# Patient Record
Sex: Female | Born: 1965 | Race: White | Hispanic: No | Marital: Married
Health system: Midwestern US, Academic
[De-identification: ages and names within clinical notes are randomized; demographics above are authoritative.]

## PROBLEM LIST (undated history)

## (undated) DIAGNOSIS — D352 Benign neoplasm of pituitary gland: Principal | ICD-10-CM

---

## 2014-07-01 ENCOUNTER — Ambulatory Visit: Admit: 2014-07-01 | Discharge: 2014-07-01 | Payer: BLUE CROSS/BLUE SHIELD | Attending: "Endocrinology

## 2014-07-01 DIAGNOSIS — E8881 Metabolic syndrome: Secondary | ICD-10-CM

## 2014-07-01 NOTE — Progress Notes (Signed)
[facility]   [facility]  Original Visit Form    07/01/2014    Wagner FBPZW:[phone] (home)   Work Phone: There is no work phone number on file.  49 y.o.  female  Race: Caucasian   Marital Status: married  No family status information on file.       HPI: Previous patient had seen Dr. Freddy Finner 15 years ago for PCOS with intracranial hypertension.  Initially referred due to intracranial hypertension which was suspected to be due to prolactinoma (went under extensive testing) declined shunt and did not follow up with Neurologist.  Due to menstrual irregularities patient was suggested to see Dr. Freddy Finner.  Patient started on metformin at that time which did improve her symptoms.    In 2006 patient started traveling to [place] and saw a different physician who decided to stop metformin therapy.  Patient was unable to get back to [place] regularly with schedule.  Patient back in town now and would like to re-establish care with Dr. Freddy Finner    Female History:  ?? Menarche: 12  ?? Periods: Irregular until 24, light  ?? Menopause: had cessation of periods for 6 years, was restarted on metformin in January and has resumed period  ?? Pregnancy: 0 Pregnancies  ?? BCx: OCPs until the age of 48, Depo Provera at 65 which caused large weight gain, fatigue, headaches    PMHX:    PCOS   Intracranial Hypertension   Suspicion regarding cushing syndrome but no diagnosis   Systemic Hypertension   Prolactinoma   Campylobacter infection history    PSHX:    Tonsillectomy when young   Toe reconstruction decades ago    FAMHX:    Sister with PCOS/Fibromyalgia   Maternal Aunt PCOS/Ovarian Cancer   Lung Cancer in Mother    Maternal side history of heart disease in early age (Uncle 16)   13 (Bladder Cancer)    SOCHX:    Working at [employer]   Married   Tobacco: None   EtOH: Rare   Illicit Drug: None    MEDS:    PCOS: Metformin 500 mg Daily    HTN: HCTZ 25mg  Daily / Toprol XL 50mg     Other: ASA / Nexium /  Flonase     Allergies:    Iodine   Lisinopril (Rash)   Sulfa (Rash)   Norvasc (Felt Loopy)       No results for input(s): CHOL, HDL, TRIG in the last 72 hours.    Invalid input(s): CHOLHDLR, LDLCALCU  CBC: No results found for: WBC, RBC, HGB, HCT, MCV, MCH, MCHC, RDW, PLT, MPV  CMP:  No results found for: NA, K, CL, CO2, BUN, CREATININE, GFRAA, AGRATIO, LABGLOM, GLUCOSE, PROT, LABALBU, CALCIUM, BILITOT, ALKPHOS, AST, ALT  BUN/Creatinine:  No results found for: BUN, CREATININE  HgBA1c:  No components found for: HGBA1C    History reviewed. No pertinent past medical history.  History reviewed. No pertinent past surgical history.  History   Substance Use Topics   ??? Smoking status: Never Smoker    ??? Smokeless tobacco: Never Used   ??? Alcohol Use: 0.6 oz/week     1 Glasses of wine per week     History reviewed. No pertinent family history.    Weight History:  High School: 100 lbs  College/Service: 140 lbs  Most: 240 lbs    Current Diet: Lean Protein, low carb, Low fat dairy, lots of vegetables    Exercise: Yes  Type:  Water Aerobics  Amount: 3x a week for an hour    Current Outpatient Prescriptions   Medication Sig Dispense Refill   ??? aspirin 81 MG chewable tablet Take 81 mg by mouth daily Chew 1 tablet(s) every day by oral route.     ??? cabergoline (DOSTINEX) 0.5 MG tablet Take 0.5 mg by mouth Twice a Week cabergoline 0.5 mg tablet     ??? hydrochlorothiazide (HYDRODIURIL) 25 MG tablet Take 25 mg by mouth daily hydrochlorothiazide 25 mg tablet     ??? ibuprofen (ADVIL;MOTRIN) 200 MG tablet Take 200 mg by mouth every 6 hours as needed one TID as needed     ??? metFORMIN (GLUCOPHAGE) 500 MG tablet Take 500 mg by mouth daily (with breakfast) metformin 500 mg tablet     ??? metoprolol (TOPROL-XL) 50 MG XL tablet Take 50 mg by mouth daily metoprolol succinate ER 50 mg tablet,extended release 24 hr     ??? fluticasone (FLONASE) 50 MCG/ACT nasal spray 2 sprays by Nasal route daily      ??? esomeprazole (NEXIUM) 40 MG capsule Take 40 mg by  mouth every morning (before breakfast)        No current facility-administered medications for this visit.       Review of Systems     Constitutional: Negative.    HENT: Negative.  Negative for hearing loss, ear pain and neck pain.    Eyes: Negative.    Respiratory: Negative.    Cardiovascular: Negative.    Gastrointestinal: Negative.    Genitourinary: Negative.    Musculoskeletal: Negative.    Skin: Negative.    Neurological: Negative.    Hematological: Negative.    Psychiatric/Behavioral: Negative.      Physical Exam   Vitals reviewed.  Constitutional: appears well-nourished.   HENT: atraumatic, normocephalic  Carotid bruits: No  Other Pulses: strong in all four extremities  Lymphadenopathy: Not examined  Thyroid-not examined   Eyes: not examined  Cardiovascular: Normal rate, regular rhythm and normal heart sounds.  Exam reveals no gallop and no friction rub. No murmur heard.  Pulmonary/Chest: not examined   Abdominal: not examined   Musculoskeletal: not examined  Neurological: not examined  Skin: not examined  Psychiatric: not examined    Arthritis: painful in am, hard to move, stiff, get better during the day, fingers and hips.     Examined by: Delorse Limber    Diagnosis:pcos, prolactinoma, obesity    Treatment Plan: increase metformin to equivalent of 2.5 g per day, check for alternative for cabergoline.   CJ Revis Whalin md

## 2014-07-02 NOTE — Telephone Encounter (Signed)
pt calling to have her prescription for Metformin transdermal 25% apply 125mg  twice a day to skin.  called into [facility] [phone] or fax [phone]

## 2014-07-03 NOTE — Telephone Encounter (Signed)
Spoke with the pharmacist at [facility]) gave him her Rx refill they will contact the patient.

## 2014-07-03 NOTE — Telephone Encounter (Signed)
[employer] has a question about the ACTH Stimulation test.   [phone]

## 2014-07-03 NOTE — Telephone Encounter (Signed)
I tried to return this call, but this is not a working number.

## 2014-07-08 LAB — METHYLMALONIC ACID, SERUM: Methylmalonic Acid, Serum: 121 nmol/L (ref 0–378)

## 2014-07-08 LAB — PAI-1+LP(A)+PAI-1 POLYMORPHISM: Lipoprotein (a): <3 mg/dL

## 2014-07-08 LAB — CMP14+LP+CBC/D/PLT+T4+TSH+H...
17-Hydroxyprogesterone: 16 ng/dL
ALT: 19 IU/L (ref 0–32)
AST: 18 IU/L (ref 0–40)
Albumin/Globulin Ratio: 1.5 (ref 1.1–2.5)
Albumin: 4.2 g/dL (ref 3.5–5.5)
Alkaline Phosphatase: 74 IU/L (ref 39–117)
Androstenedione: 82 ng/dL (ref 41–262)
BUN/Creatinine Ratio: 17 (ref 9–23)
BUN: 13 mg/dL (ref 6–24)
Basophils %: 0 %
Basophils Absolute: 0 10*3/uL (ref 0.0–0.2)
C-Peptide: 4.2 ng/mL (ref 1.1–4.4)
CO2: 23 mmol/L (ref 18–29)
Calcium: 9.2 mg/dL (ref 8.7–10.2)
Chloride: 100 mmol/L (ref 97–108)
Cholesterol, Total: 207 mg/dL — ABNORMAL HIGH (ref 100–199)
Cortisol: 10.5 ug/dL
Creatinine: 0.76 mg/dL (ref 0.57–1.00)
DHEAS (DHEA Sulfate): 118.7 ug/dL (ref 41.2–243.7)
Eosinophils %: 2 %
Eosinophils Absolute: 0.1 10*3/uL (ref 0.0–0.4)
Estradiol, Sensitive: <5 pg/mL
FSH: 15.3 m[IU]/mL
Free Testosterone: 2.2 pg/mL (ref 0.0–4.2)
GFR African American: 106 mL/min/{1.73_m2} (ref >59)
GFR Non-African American: 92 mL/min/{1.73_m2} (ref >59)
Globulin: 2.8 g/dL (ref 1.5–4.5)
Glucose: 85 mg/dL (ref 65–99)
Granulocyte Immature Abs: 0 10*3/uL (ref 0.0–0.1)
HDL: 46 mg/dL (ref >39)
Hematocrit: 41.5 % (ref 34.0–46.6)
Hemoglobin A1C: 5.4 % (ref 4.8–5.6)
Hemoglobin: 13.8 g/dL (ref 11.1–15.9)
Homocystine,Blood: 17.2 umol/L — ABNORMAL HIGH (ref 0.0–15.0)
Immature Granulocytes: 0 %
Insulin: 16.2 u[IU]/mL (ref 2.6–24.9)
LDL Calculated: 115 mg/dL — ABNORMAL HIGH (ref 0–99)
LH: 7.7 m[IU]/mL
Lactate Serum/Plasma: 7.1 mg/dL (ref 4.5–19.8)
Lymphocytes %: 30 %
Lymphocytes Absolute: 2.2 10*3/uL (ref 0.7–3.1)
MCH: 27.9 pg (ref 26.6–33.0)
MCHC: 33.3 g/dL (ref 31.5–35.7)
MCV: 84 fL (ref 79–97)
Monocytes %: 6 %
Monocytes Absolute: 0.5 10*3/uL (ref 0.1–0.9)
Neutrophils Absolute: 4.7 10*3/uL (ref 1.4–7.0)
Platelets: 268 10*3/uL (ref 150–379)
Potassium: 4 mmol/L (ref 3.5–5.2)
Progesterone: 0.3 ng/mL
Prolactin: 92.8 ng/mL — ABNORMAL HIGH (ref 4.8–23.3)
RBC: 4.94 x10E6/uL (ref 3.77–5.28)
RDW: 14.6 % (ref 12.3–15.4)
Segs Relative: 62 %
Sex Hormone Binding: 28.5 nmol/L (ref 17.3–125.0)
Sodium: 143 mmol/L (ref 134–144)
T4, Total: 6.6 ug/dL (ref 4.5–12.0)
Thyrotropin: 1.41 u[IU]/mL (ref 0.450–4.500)
Total Bilirubin: 0.3 mg/dL (ref 0.0–1.2)
Total Protein: 7 g/dL (ref 6.0–8.5)
Total Testosterone: 18 ng/dL (ref 3–41)
Triglycerides: 228 mg/dL — ABNORMAL HIGH (ref 0–149)
VLDL Cholesterol Calculated: 46 mg/dL — ABNORMAL HIGH (ref 5–40)
WBC: 7.5 10*3/uL (ref 3.4–10.8)
hCG, Beta Chain, Quant, S: <1 m[IU]/mL

## 2014-07-08 LAB — ANA COMPREHENSIVE PANEL
CENTROMERE PROTEIN B ANTIBODY: <0.2 AI (ref 0.0–0.9)
Chromatin Antibody: 0.2 AI (ref 0.0–0.9)
ENA SSA (RO) Ab: <0.2 AI (ref 0.0–0.9)
ENA SSB (LA) Ab: <0.2 AI (ref 0.0–0.9)
JO-1 EXTRACTABLE NUCLEAR ANTIBODY: <0.2 AI (ref 0.0–0.9)
RIBONUCLEOPROTEIN EXTRACTABLE: <0.2 AI (ref 0.0–0.9)
SCL-70 EXTRACTABLE NUCLEAR AB: 2.2 AI — ABNORMAL HIGH (ref 0.0–0.9)
SM AB: <0.2 AI (ref 0.0–0.9)
dsDNA Ab: 18 IU/mL — ABNORMAL HIGH (ref 0–9)

## 2014-07-08 LAB — VITAMIN D 25 HYDROXY: Vit D, 25-Hydroxy: 21.5 ng/mL — ABNORMAL LOW (ref 30.0–100.0)

## 2014-07-08 LAB — C-REACTIVE PROTEIN: wr-CRP: 4 mg/L (ref 0.0–4.9)

## 2014-07-08 LAB — URIC ACID: Uric Acid, Serum: 9.9 mg/dL — ABNORMAL HIGH (ref 2.5–7.1)

## 2014-07-08 LAB — REQUEST PROBLEM

## 2014-07-08 LAB — VITAMIN B6: Vitamin B6, Plasma: 7.2 ug/L (ref 2.0–32.8)

## 2014-07-08 LAB — RHEUMATOID FACTOR: Rheumatoid Factor: 31.3 IU/mL — ABNORMAL HIGH (ref 0.0–13.9)

## 2014-07-08 LAB — SEDIMENTATION RATE, AUTOMATED: Sed Rate: 17 mm/hr (ref 0–40)

## 2014-07-08 LAB — VITAMIN B12: Vitamin B-12: 592 pg/mL (ref 211–946)

## 2014-07-08 NOTE — Telephone Encounter (Signed)
No one has called [place] back in regard to this that I know of.

## 2014-07-18 MED ORDER — BROMOCRIPTINE MESYLATE 2.5 MG PO TABS
2.5 MG | ORAL_TABLET | Freq: Every day | ORAL | Status: AC
Start: 2014-07-18 — End: ?

## 2014-07-18 MED ORDER — VITAMIN D (ERGOCALCIFEROL) 1.25 MG (50000 UT) PO CAPS
1.25 MG (50000 UT) | ORAL_CAPSULE | ORAL | Status: DC
Start: 2014-07-18 — End: 2014-10-01

## 2014-07-18 MED ORDER — METANX 3-35-2 MG PO TABS
3-35-2 MG | ORAL_TABLET | Freq: Every day | ORAL | Status: DC
Start: 2014-07-18 — End: 2014-10-01

## 2014-07-18 NOTE — Telephone Encounter (Signed)
Based off the findings from recently laboratory testing that was done [facility] wants pt to add vitamin D 50,000 units once a week. Also needs to start bromocriptine 1.5 BID ( I ordered 2.5 dosage pt will take once daily) and metanx. Pt informed. Rx sent.

## 2014-07-21 ENCOUNTER — Ambulatory Visit: Admit: 2014-07-21 | Discharge: 2014-07-21 | Payer: BLUE CROSS/BLUE SHIELD

## 2014-07-21 ENCOUNTER — Institutional Professional Consult (permissible substitution): Admit: 2014-07-21 | Discharge: 2014-07-21 | Payer: BLUE CROSS/BLUE SHIELD | Attending: Registered"

## 2014-07-21 DIAGNOSIS — E8881 Metabolic syndrome: Secondary | ICD-10-CM

## 2014-07-21 DIAGNOSIS — E282 Polycystic ovarian syndrome: Secondary | ICD-10-CM

## 2014-07-21 NOTE — Progress Notes (Addendum)
[facility] Follow Up Visit    HPI: Destiny Ortiz is a 49 y.o. year old female.  She is here following up for:   Patient Active Problem List   Diagnosis   ??? Back pain   ??? Pain in the coccyx   ??? Non-specific colitis   ??? Essential hypertension   ??? Disorder of eustachian tube   ??? History of neoplasm   ??? Hypertension   ??? 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 Neck pain   ??? Neurogenic bowel   ??? Prolactinoma ([place])   ??? Radicular pain         Patient Care Team:  Ascencion Dike as PCP - General    Reason For Visit:  Chief Complaint   Patient presents with   ??? Check-Up   ??? Other     pituitary tumor   ??? Other     insulin resistance     Patient Active Problem List   Diagnosis   ??? Back pain   ??? Pain in the coccyx   ??? Non-specific colitis   ??? Essential hypertension   ??? Disorder of eustachian tube   ??? History of neoplasm   ??? Hypertension   ??? 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 Neck pain   ??? Neurogenic bowel   ??? Prolactinoma ([place])   ??? Radicular pain       Review of Systems : Pt. Presents today with a deep, almost continuous cough. Her husband also has this and was seen in the ER. They told him it was viral. She is out of the albuterol which she puts in her nebulizer and she needs   New tubes which go to the machine. She has an appointment to see her pcp tomorrow.    She just received her medications ie:  Metanx, Vit D, and Bromocriptine. She needs to know how to transition to the Bromocriptine.    She wants to know which rheumatologist she should see. We can give her a referral to Dr. Annamaria Boots at [facility].  She has not made an appointment yet.    She is now using the transdermal Metformin, 2 clicks twice a day. She had a period in May which was the first she has had in 7 years.    She c/o having a "hump" on the back of her neck.    Constitutional: Negative.    HENT: Negative for hearing loss, ear pain and neck pain.    Cardiovascular: HTN    Endocrine: insulin resistance, PCOS.  Gastrointestinal: neurogenic bowel     Genitourinary: Negative.    Neurological: Negative.    Hematological: Negative.   Musculoskeletal: High sed rate, hand and neck pain  Skin/Hair: Negative.  All other systems reviewed and are negative.    PAST LAB RESULTS:    Lab Results   Component Value Date    TRIG 228* 07/01/2014    HDL 46 07/01/2014    LDLCALC 115* 07/01/2014    LABVLDL 46* 07/01/2014    CHOL 207* 07/01/2014     Lab Results   Component Value Date    GLUCOSE 85 07/01/2014    LABA1C 5.4 07/01/2014    T4TOTAL 6.6 07/01/2014      No past medical history on file.  History   Substance Use Topics   ??? Smoking status: Never Smoker    ??? Smokeless tobacco: Never Used   ??? Alcohol Use: 0.6 oz/week     1 Glasses of wine per week  General: Pt. appears well and in no distress.    Obese: Yes    Pain Assessment:    Pain Present: yes  Location:hands, hips, neck  Diet: Yes  Type: low carb  Exercise: Yes  Frequency:PT  3 days a week and water therapy    Current Outpatient Prescriptions   Medication Sig Dispense Refill   ??? albuterol sulfate HFA 108 (90 BASE) MCG/ACT inhaler Inhale 2 puffs into the lungs every 6 hours as needed for Wheezing     ??? L-methylfolate-B6-B12 (METANX) 3-35-2 MG tablet Take 1 tablet by mouth daily 60 tablet 5   ??? vitamin D (ERGOCALCIFEROL) 50000 UNITS CAPS capsule Take 1 capsule by mouth once a week 4 capsule 5   ??? bromocriptine (PARLODEL) 2.5 MG tablet Take 1 tablet by mouth daily 30 tablet 5   ??? MISC NATURAL PRODUCTS PO Apply topically Metformin In  Transdermal Base    25% ( 250 mg/ml)    apply 125mg  twice a day     ??? aspirin 81 MG chewable tablet Take 81 mg by mouth daily Chew 1 tablet(s) every day by oral route.     ??? cabergoline (DOSTINEX) 0.5 MG tablet Take 0.5 mg by mouth Twice a Week cabergoline 0.5 mg tablet     ??? hydrochlorothiazide (HYDRODIURIL) 25 MG tablet Take 25 mg by mouth daily hydrochlorothiazide 25 mg tablet     ??? ibuprofen (ADVIL;MOTRIN) 200 MG tablet Take 200 mg by mouth every 6 hours as needed one TID as needed      ??? metoprolol (TOPROL-XL) 50 MG XL tablet Take 50 mg by mouth daily metoprolol succinate ER 50 mg tablet,extended release 24 hr     ??? fluticasone (FLONASE) 50 MCG/ACT nasal spray 2 sprays by Nasal route daily      ??? esomeprazole (NEXIUM) 40 MG capsule Take 40 mg by mouth every morning (before breakfast)        No current facility-administered medications for this visit.       Physical Exam   Vitals reviewed.  Constitutional: appears well-nourished.   HENT: EOMI, no lid lag, no proptosis, no arcus senilis, no cervical adenopathy,  But has some tenderness under her chin on palpation  Carotid bruits: No  Lymphadenopathy: No cervical adenopathy  Thyroid: no enlargement, no nodules, no tenderness  Cardiovascular: Normal rate, regular rhythm and normal heart sounds.  Exam reveals no gallop and no friction rub. No murmur heard.  Pulmonary/Chest: Lungs are clear to auscultation, no wheezing, breathing is unlabored    Abdomen:nontender,no organomegaly, positive bowel sounds  Musculoskeletal: no kyphosis, no scoliosis, no joint pain, no muscle pain  Edema:no  LE Pulses: not examined  Abnormalities: asthma/bronchitis currenty  Neurological: Has steady gait, no tremor   Skin: Normal turgor and texture, Ac Nig N: no  Psychiatric: Pt is alert and oriented X 3.    LORDOSIS AND SCOLIOSIS     Examined by: Leonia Reeves PA-C     Plan: Begin Vit D 50,000 units, Metanx, and Bromocriptine           Continue Metformin transdermal. Return 1 month, see how she is doing. Jaynee Eagles MD

## 2014-07-21 NOTE — Progress Notes (Signed)
[facility]  [facility]  Initial Nutrition Consultation Record    Destiny Ortiz    No past medical history on file.  Current Outpatient Prescriptions   Medication Sig Dispense Refill   ??? albuterol sulfate HFA 108 (90 BASE) MCG/ACT inhaler Inhale 2 puffs into the lungs every 6 hours as needed for Wheezing     ??? L-methylfolate-B6-B12 (METANX) 3-35-2 MG tablet Take 1 tablet by mouth daily 60 tablet 5   ??? vitamin D (ERGOCALCIFEROL) 50000 UNITS CAPS capsule Take 1 capsule by mouth once a week 4 capsule 5   ??? bromocriptine (PARLODEL) 2.5 MG tablet Take 1 tablet by mouth daily 30 tablet 5   ??? MISC NATURAL PRODUCTS PO Apply topically Metformin In  Transdermal Base    25% ( 250 mg/ml)    apply 125mg  twice a day     ??? aspirin 81 MG chewable tablet Take 81 mg by mouth daily Chew 1 tablet(s) every day by oral route.     ??? cabergoline (DOSTINEX) 0.5 MG tablet Take 0.5 mg by mouth Twice a Week cabergoline 0.5 mg tablet     ??? hydrochlorothiazide (HYDRODIURIL) 25 MG tablet Take 25 mg by mouth daily hydrochlorothiazide 25 mg tablet     ??? ibuprofen (ADVIL;MOTRIN) 200 MG tablet Take 200 mg by mouth every 6 hours as needed one TID as needed     ??? metoprolol (TOPROL-XL) 50 MG XL tablet Take 50 mg by mouth daily metoprolol succinate ER 50 mg tablet,extended release 24 hr     ??? fluticasone (FLONASE) 50 MCG/ACT nasal spray 2 sprays by Nasal route daily      ??? esomeprazole (NEXIUM) 40 MG capsule Take 40 mg by mouth every morning (before breakfast)        No current facility-administered medications for this visit.       Assessment of Current Diet: 1- Poor    07/21/2014      FLP:    Lab Results   Component Value Date    TRIG 228 07/01/2014    HDL 46 07/01/2014    LDLCALC 115 07/01/2014    LABVLDL 46 07/01/2014       There were no vitals filed for this visit.     Diet Rx: IR  RD: KW    Breakfast: 6:30-7:00: Egg or string cheese + 3 oz lunch meat + 2-3 cups coffee + 2 tbls whipping cream/cup  Lunch: Time varies: chix +  carrots/celery/iceberg/bacon/Cheddar + Ranch or O&V; Out - Brios chix salad/nuts/berries/cheese + crax or Chipotle bowl - rice/beans/sour cream/cheese/guac + 12 chips  Dinner: 7:30: MFP (5) + salad/veggies/olives/cheese/bacon/Ranch or O&V or Caesar; occ + pretzel krisps  Snacks: rare  Misc:     Food Allergies:     Dines Out:  Lunches per wk: 2  Dinners per wk: 2    ETOH: 2 per week    Exercise:  Type: water physical therapy  Frequency: 3 per week  Duration: 45-60 minutes    Education:  Patient  Focus of Education: Eating plan; including some carbs with protein  Comprehension: VG  Motivation: Fair - Good    Plan: Follow plan   Portion control   Less meat/cheese - Include some carbs - always w/ protein   Lowfat cheese replaces MFP   Smaller meals; include small snacks   Eat slowly   Exercise   Food Record    Nutritional Counseling: 15 minutes X [employer]    [facility] Patient  Education Handout Checklist    Instruction Materials: Quick Sheet and Fruit/Vegetable Serving Sizes  Vitamins & Minerals:   Restaurants:   Misc:   Shopper's Guides: Conservator, museum/gallery, Crackers/CC and Salad Dressing/CC

## 2014-09-01 ENCOUNTER — Ambulatory Visit: Admit: 2014-09-01 | Discharge: 2014-09-01 | Payer: BLUE CROSS/BLUE SHIELD

## 2014-09-01 DIAGNOSIS — I10 Essential (primary) hypertension: Secondary | ICD-10-CM

## 2014-09-01 NOTE — Progress Notes (Signed)
[facility]   [facility] Follow Up Visit      HPI: Destiny Ortiz is a 49 y.o. year old female GERD, HTN, Nephrolithiasis, Hyperuricemia, Intracranial HTN, headache, homocystenemia she is here following up for: PCOS and Prolactinoma    Pain in BLT hands, back and shoulder in last 6 months.  Patient has trouble gripping things with right hand.   Patient has skin sensitivity.  Has GERD.  Hands turn different color in cold weather. 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  Elevated RF, ds-DNA, SCL-70 Possible CREST Syndrome??  She c/o that her back and legs hurt so bad that she can't sleep. She is up about 2 hours a night.  Rheumatologist (Dr. Candace Gallus) to be seen this Thursday.     Hyperuricemia: Currently on Uloric.    At last visit switched to Bromocriptine from cabergoline. Symptoms not any better.     Many years ago DDX with ICH by [place].  Headaches better after treatment.  Started on Topical Metformin one month ago. Previously not able to tolerate metformin due to GI issues.  Few Lbs weight loss.  Last two months had periods, previously 9 years ago.    Currently: Vitamin D 50K/wk, Metanx one qD, Topical metformin BID, Bromcripitine.     History of prior Heart Attach, Stroke, or Vascular problems: no  PCP: Ascencion Dike      Reason For Visit:  Chief Complaint   Patient presents with   ??? Check-Up   ??? Hyperlipidemia       Review of Systems :    Constitutional: Negative.    HENT: Negative for hearing loss, ear pain and neck pain.    Cardiovascular: HTN    Gastrointestinal: recent choking   Genitourinary: Negative.    Neurological: probable Raynaud's    Hematological: Negative.   Musculoskeletal: muscle and joint pain  Skin/Hair: Negative.  All other systems reviewed and are negative.    History reviewed. No pertinent past medical history.  Social History   Substance Use Topics   ??? Smoking status:  Never Smoker   ??? Smokeless tobacco: Never Used   ??? Alcohol use 0.6 oz/week     1 Glasses of wine per week       General:     Obese: Yes    Pain Assessment:    Pain Present: yes  Diet: Yes  Exercise: Yes    Current Outpatient Prescriptions   Medication Sig Dispense Refill   ??? albuterol sulfate HFA 108 (90 BASE) MCG/ACT inhaler Inhale 2 puffs into the lungs every 6 hours as needed for Wheezing     ??? L-methylfolate-B6-B12 (METANX) 3-35-2 MG tablet Take 1 tablet by mouth daily 60 tablet 5   ??? vitamin D (ERGOCALCIFEROL) 50000 UNITS CAPS capsule Take 1 capsule by mouth once a week 4 capsule 5   ??? bromocriptine (PARLODEL) 2.5 MG tablet Take 1 tablet by mouth daily 30 tablet 5   ??? MISC NATURAL PRODUCTS PO Apply topically Metformin In  Transdermal Base    25% ( 250 mg/ml)    apply 125mg  twice a day     ??? aspirin 81 MG chewable tablet Take 81 mg by mouth daily Chew 1 tablet(s) every day by oral route.     ??? cabergoline (DOSTINEX) 0.5 MG tablet Take 0.5 mg by mouth Twice a Week cabergoline 0.5 mg tablet     ???  hydrochlorothiazide (HYDRODIURIL) 25 MG tablet Take 25 mg by mouth daily hydrochlorothiazide 25 mg tablet     ??? ibuprofen (ADVIL;MOTRIN) 200 MG tablet Take 200 mg by mouth every 6 hours as needed one TID as needed     ??? metoprolol (TOPROL-XL) 50 MG XL tablet Take 50 mg by mouth daily metoprolol succinate ER 50 mg tablet,extended release 24 hr     ??? fluticasone (FLONASE) 50 MCG/ACT nasal spray 2 sprays by Nasal route daily      ??? esomeprazole (NEXIUM) 40 MG capsule Take 40 mg by mouth every morning (before breakfast)        No current facility-administered medications for this visit.        Physical Exam:  Vitals reviewed.  Constitutional: appears well-nourished.   HENT: EOMI, no lid lag, no proptosis, no arcus senilis, no cervical adenopathy  Carotid bruits: No  Lymphadenopathy: No cervical adenopathy  Thyroid: no enlargement, no nodules, no tenderness  Cardiovascular: Normal rate, regular rhythm and normal heart sounds.   Exam reveals no gallop and no friction rub. No murmur heard.  Pulmonary/Chest: Lungs are clear to auscultation, no wheezing, breathing is unlabored    Abdomen:nontender,no organomegaly, positive bowel sounds  Musculoskeletal: no kyphosis, no scoliosis, no joint pain, no muscle pain  Edema: no  LE Pulses:yes  Neurological: Has steady gait, no tremor   Skin: Normal turgor and texture  Psychiatric: Pt is alert and oriented  Examined by: Sylvie Farrier MD and Leonia Reeves [facility]    Plan: Continue current meds            See rheumatologist

## 2014-09-02 NOTE — Unmapped (Signed)
Subjective  HPI:   Patient ID: Nicole Byrd is a 49 y.o. Caucasian female.    Chief Complaint:  HPI  Chief Complaint   Patient presents with   ??? New Patient Visit/ Consultation       Nicole Byrd was referred for Lupus evaluation/autoimmune disease     Main complaint today is fatigue. Not sleeping well but no history of sleep apnea. Has hand pain which started almost 9 months ago. Am stiffness 1 h in am. Pain is more in am after rest. Has feet pain which started later 7 month ago. Has muscle pain and cramps.   Has severe low back pain. Hips tender to touch.  Skin tender and muscle tender.   Has headaches.   Has rosacea but not sure about other antibodies.   Some photosensitivity.   No Raynaud's but dorsum of hand is white in cold.            Histories:     Past Medical History   Diagnosis Date   ??? Neck pain 06/01/10   ??? History of pituitary tumor    ??? Essential hypertension, benign    ??? GERD (gastroesophageal reflux disease)    ??? Chicken pox    ??? Mumps    ??? Shingles    ??? Anxiety    ??? Night sweat    ??? Hypertension    ??? Hypercholesteremia    ??? Aching headache    ??? Hypoprolactinemia        History     Social History   ??? Marital Status: Married     Spouse Name: N/A   ??? Number of Children: N/A   ??? Years of Education: N/A     Social History Main Topics   ??? Smoking status: Never Smoker    ??? Smokeless tobacco: Never Used   ??? Alcohol Use: 0.6 oz/week     1 Glasses of wine per week   ??? Drug Use: Not on file   ??? Sexual Activity: Not on file     Other Topics Concern   ??? None     Social History Narrative       Family History   Problem Relation Age of Onset   ??? Cancer Mother    ??? Hypertension Mother    ??? Diabetes Mother    ??? Anuerysm Mother    ??? Clotting disorder Mother    ??? Broken bones Maternal Grandmother          ROS:   Review of Systems   Constitutional: Positive for fatigue. Negative for chills, activity change and appetite change.   HENT: Negative for facial swelling.         Dry mouth     Eyes: Negative for pain,  discharge and itching.        Dry eyes   Respiratory: Negative for apnea, choking and chest tightness.    Cardiovascular: Negative for chest pain, palpitations and leg swelling.   Gastrointestinal: Positive for heartburn and diarrhea. Negative for abdominal pain, abdominal distention and anal bleeding.   Genitourinary: Negative for dysuria, difficulty urinating and dyspareunia.   Musculoskeletal: Positive for myalgias, arthralgias, neck pain and neck stiffness.   Skin: Positive for color change and rash. Negative for pallor.   Neurological: Positive for weakness. Negative for dizziness, facial asymmetry and headaches.   Hematological: Negative for adenopathy. Does not bruise/bleed easily.   Psychiatric/Behavioral: Negative for behavioral problems, confusion and agitation.       Objective:  Physical Exam   Constitutional: She is oriented to person, place, and time. She appears well-developed and well-nourished. No distress.   HENT:   Head: Normocephalic and atraumatic.   Right Ear: External ear normal.   Left Ear: External ear normal.   Nose: Nose normal.   Mouth/Throat: Oropharynx is clear and moist. No oropharyngeal exudate.   Eyes: Conjunctivae and EOM are normal. Pupils are equal, round, and reactive to light. Right eye exhibits no discharge. Left eye exhibits no discharge. No scleral icterus.   Neck: Normal range of motion. Neck supple. No JVD present. No tracheal deviation present. No thyromegaly present.   Cardiovascular: Normal rate and normal heart sounds.  Exam reveals no gallop.    No murmur heard.  Pulmonary/Chest: Effort normal and breath sounds normal. She has no wheezes. She has no rales. She exhibits no tenderness.   Abdominal: Soft. Bowel sounds are normal. She exhibits no distension. There is no tenderness. There is no rebound and no guarding.   Musculoskeletal: Normal range of motion. She exhibits no edema or tenderness.   Tender CMC thumbs  Tender MCPs and PIPs  Tender ankles and MTP  Minimal  swelling MCPs and ankles  Tender points 14/18   Lymphadenopathy:     She has no cervical adenopathy.   Neurological: She is alert and oriented to person, place, and time. No cranial nerve deficit. She exhibits normal muscle tone. Coordination normal.   Skin: Skin is warm and dry. No rash noted. She is not diaphoretic. No erythema. No pallor.   Psychiatric: She has a normal mood and affect. Her behavior is normal. Judgment and thought content normal.       Medications:     Current outpatient prescriptions:   ???  amLODIPine (NORVASC) 5 MG tablet, Take 5 mg by mouth daily., Disp: , Rfl:   ???  aspirin 81 MG chewable tablet, Take 81 mg by mouth daily Chew 1 tablet(s) every day by oral route., Disp: , Rfl:   ???  bromocriptine (PARLODEL) 2.5 mg tablet, Take 1 tablet by mouth daily, Disp: , Rfl:   ???  ergocalciferol (ERGOCALCIFEROL) 50,000 unit capsule, Take 1 capsule by mouth once a week, Disp: , Rfl:   ???  esomeprazole (NEXIUM) 40 MG capsule, Take 40 mg by mouth every morning (before breakfast) , Disp: , Rfl:   ???  febuxostat (ULORIC) 40 mg, Take 40 mg by mouth daily., Disp: , Rfl:   ???  hydrochlorothiazide (HYDRODIURIL) 25 MG tablet, Take 25 mg by mouth daily hydrochlorothiazide 25 mg tablet, Disp: , Rfl:   ???  metoprolol succinate (TOPROL-XL) 50 MG 24 hr tablet, Take 50 mg by mouth daily metoprolol succinate ER 50 mg tablet,extended release 24 hr, Disp: , Rfl:   ???  albuterol (PROVENTIL) 2.5 mg /3 mL (0.083 %) nebulizer solution, Inhale into the lungs., Disp: , Rfl:   ???  albuterol (PROVENTIL;VENTOLIN;PROAIR) 90 mcg/actuation inhaler, Inhale 2 puffs into the lungs every 6 hours as needed for Wheezing, Disp: , Rfl:   ???  cabergoline (DOSTINEX) 0.5 mg tablet, Take 0.5 mg by mouth Twice a Week cabergoline 0.5 mg tablet, Disp: , Rfl:   ???  fexofenadine (ALLEGRA) 60 MG tablet, Take by mouth., Disp: , Rfl:   ???  fluticasone (FLONASE) 50 mcg/actuation nasal spray, 2 sprays by Nasal route daily , Disp: , Rfl:   ???  ibuprofen (ADVIL,MOTRIN)  800 MG tablet, Take by mouth., Disp: , Rfl:   ???  levomefol-B6-meB12-algal oil (METANX/FOLTANX) 3 mg-35  mg-2 mg -90.314 mg Cap, , Disp: , Rfl:   ???  methylPREDNISolone (MEDROL DOSEPACK) 4 mg tablet, Take as directed, Disp: , Rfl:     Allergies:     Allergies   Allergen Reactions   ??? Iodinated Contrast Media - Iv Dye    ??? Iohexol Hives   ??? Lisinopril-Hydrochlorothiazide    ??? Sulfa (Sulfonamide Antibiotics) Hives       Lab Review:   No results found for any previous visit.      Sed Rate 17 0 - 40 mm/hr   Back to top of Results from Last 3 Months      PAI-1+LP(A)+PAI-1 POLYMORPHISM (07/01/2014 4:52 PM)  PAI-1+LP(A)+PAI-1 POLYMORPHISM (07/01/2014 4:52 PM)   Component Value Ref Range   Lipoprotein (a) <3  Comment:   The mean and median Lp(a) concentration of theAfrican-American population is approximately   twice that ofthe Caucasian population, although studies have shown thatelevated Lp(a)   concentration is not an independent riskfactor for developing atherosclerotic disease in   theAfrican-American population.Reference Range:<31     mg/dL   Plasminogen Act Inhibitor-1 CANCELED  Comment:   Test not performedTEST NOT PERFORMEDPlasminogen Activator Inhibitor-1 (PAI-1 activity)  testingcannot be performed because the manufacturer of PAI-1activity kits has taken the kit  off of the market andrecalled all previously distributed kits.Reference Range:<31.1    Result canceled by the ancillary     IU/mL   PAI-1 Locus 4G/5G Polymorphism Comment  Comment:   Patient DNA was evaluated for the PAI-1 4G/5G promoterpolymorphism, which is a single base   pair guanine (4G/5G)deletion/insertion polymorphism, using polymerase chainreaction (PCR)   technology and restriction fragment lengthpolymorphism (RFLP).     ??   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 ??   Interpretation: Comment  Comment:   This individual has two copies of the 5G allele, also knownas the 5G/5G genotype of the   plasminogen  activatorinhibitor type 1 (PAI-1) gene. The 5G/5G genotype isassociated with the  lowest PAI-1 activity and antigenlevels compared to those individuals that have either   the4G/4G or 4G/5G genotype. Elevated PAI-1 levels areassociated with an increased risk of   coronary arterydisease, venous thromboembolic disease and possiblycomplications of pregnancy  such as recurrent abortion.     ??   Comments: Comment  Comment:   Simultaneous Risks: If a patient possesses two or morecongenital or acquired risk factors,   the risk of diseasemay rise to more than the sum of the risk ratios for theindividual risk   factors. For instance, a combination of the4G/4G genotype and the insulin resistance   syndrome mayconfer an increase in cardiovascular disease risk over thatconferred by the   presence of an isolated PAI-1 4G/4Gpolymorphism.Recommendations for Genetic Counseling: The   PAI-1 4G alleleis an inherited characteristic. If the polymorphism ispresent in a   heterozygous or homozygous fashion, werecommend that the patient and their family   considergenetic counseling to obtain additional information oninheritance and to identify   other family members at risk.Testing Characteristics: Genetic testing by PCR   providesexceptionally high sensitivity and specificity. Incorrectgenotyping results can be   caused by rare polymorphisms inprimer binding sites and to misidentification of specimensby   collectors or laboratory personnel. This assay analyzesonly the PAI 4G/5G locus and does not  measure geneticabnormalities elsewhere in the genome.This test was developed and its   performance characteristicsdetermined by [employer]. It has not been cleared or approvedby the   Food and Drug Administration.References:Barcellona D. Thromb Haemost.   2003;90:1061.;Dossenbach-Glaninger.  Clin Chem. 0981;19:1478.; Evonnie Dawes. NEJM.   2000;342:1792.; Margaglione M et al. ArteriosclThromb and Vasc Bio. [phone].   ??   Back to top of Results  from Last 3 Months      REQUEST PROBLEM (07/01/2014 4:52 PM)  REQUEST PROBLEM (07/01/2014 4:52 PM)   Component Value Ref Range   Request Problem Comment  Comment:   No specimen received.TEST: [id number]???? PANEL CORTISOL, ACTH STIMULATIONCONTACTED: CHRIS H.   (REGISTRAR) AT YOUR FACILITY ON 07-04-2014     ??     REQUEST PROBLEM (07/01/2014 4:52 PM)   Narrative   Performed at: [facility]*[address]*[place] [phone]??   Lab Director: Annamarie Major PhD??   Performed at: [facility] [place]*[address]*[place] [phone]??   Lab Director: Mila Homer MD??   Performed at: [facility]*[address]*[place] ??   [phone]??   Lab Director: Nonda Lou MD??    Back to top of Results from Last 3 Months      Methylmalonic Acid, Serum (07/01/2014 4:52 PM)  Methylmalonic Acid, Serum (07/01/2014 4:52 PM)   Component Value Ref Range   Methylmalonic Acid, SERUM 121 0 - 378 nmol/L   Back to top of Results from Last 3 Months      Vitamin B6 (07/01/2014 4:52 PM)  Vitamin B6 (07/01/2014 4:52 PM)   Component Value Ref Range   Vitamin B6, Plasma 7.2 2.0 - 32.8 ug/L   Back to top of Results from Last 3 Months      ANA COMPREHENSIVE (07/01/2014 4:52 PM)  ANA COMPREHENSIVE (07/01/2014 4:52 PM)   Component Value Ref Range   dsDNA Ab 18 (H)Comment: Negative <5Equivocal 5 - 9Positive >9 0 - 9 IU/mL   RIBONUCLEOPROTEIN EXTRACTABLE <0.2 0.0 - 0.9 AI   SM AB <0.2 0.0 - 0.9 AI   SCL-70 EXTRACTABLE NUCLEAR AB 2.2 (H) 0.0 - 0.9 AI   ENA SSA (RO) Ab <0.2 0.0 - 0.9 AI   ENA SSB (LA) Ab <0.2 0.0 - 0.9 AI   CHROMATIN ANTIBODY 0.2 0.0 - 0.9 AI   JO-1 EXTRACTABLE NUCLEAR ANTIBODY <0.2 0.0 - 0.9 AI   CENTROMERE PROTEIN B ANTIBODY <0.2 0.0 - 0.9 AI   See below: Comment  Comment:   Autoantibody???????????????????????????????????????????? Disease   Association------------------------------------------------------------Condition????????????????????????  ????????????Frequency---------------------????  ------------------------???? ---------Antinuclear   Antibody,????????SLE, mixed connectiveDirect (ANA-D)???????????????????? tissue   diseases---------------------???? ------------------------???? ---------dsDNA????????????????????????????????????   SLE????????????????????????????????????????????????40 - 60%---------------------???? ------------------------????   ---------Chromatin????????????????????????????????Drug induced SLE????????????????????????????????90%SLE????????????????????????????????????????  ????????48 - 97%---------------------???? ------------------------???? ---------SSA (Ro)????????????????????????  ???????? SLE????????????????????????????????????????????????25 - 35%Sjogren's Syndrome???????????????? 40 - 70%Neonatal Lupus????   ????????????????????????????100%---------------------???? ------------------------???? ---------SSB (La)????????????  ???????????????????? SLE???????????????????????????????????????????????????????? 10%Sjogren's Syndrome????????????????????????????  30%---------------------???? -----------------------????????---------Sm (anti-Smith)????????????????????SLE   ???????????????????????????????????????????? 15 - 30%---------------------???? -----------------------????????  ---------RNP????????????????????????????????????????????Mixed Connective TissueDisease????????????????????????????????????????????????   95%(U1 nRNP,????????????????????????????????SLE????????????????????????????????????????????????30 - 50%anti-ribonucleoprotein)????  Polymyositis and/orDermatomyositis???????????????????????????????? 20%---------------------????   ------------------------???? ---------Scl-70 (antiDNA????????????????????Scleroderma (diffuse)????????????20 -  35%topoisomerase)???????????????????? Crest???????????????????????????????????????????????????? 13%---------------------????   ------------------------???? ---------Jo-1???????????????????????????????????????? Polymyositis   and/orDermatomyositis????????????????????????20 - 40%---------------------???? ------------------------????   ---------Centromere B???????????????????????? Scleroderma - Crestvariant???????????????????????????????????????????????? 80%   ??   Back to top of Results from Last 3 Months      Vitamin B12 (07/01/2014 4:52 PM)  Vitamin B12 (07/01/2014 4:52 PM)   Component Value Ref Range   Vitamin B-12 592 211 - 946 pg/mL   Back to top of Results from Last 3 Months      Uric Acid (07/01/2014 4:52 PM)  Uric Acid (07/01/2014 4:52 PM)   Component Value  Ref Range   Uric Acid, Serum 9.9 (H)Comment: Therapeutic target for gout patients: <6.0 2.5 - 7.1 mg/dL  Back to top of Results from Last 3 Months      C-Reactive Protein (07/01/2014 4:52 PM)  C-Reactive Protein (07/01/2014 4:52 PM)   Component Value Ref Range   wr-CRP 4.0 0.0 - 4.9 mg/L   Back to top of Results from Last 3 Months      Vitamin D 25 Hydroxy (07/01/2014 4:52 PM)  Vitamin D 25 Hydroxy (07/01/2014 4:52 PM)   Component Value Ref Range   Vit D, 25-Hydroxy 21.5 (L)  Comment:   Vitamin D deficiency has been defined by the Institute ofMedicine and an Endocrine Society   practice guideline as alevel of serum 25-[place] vitamin D less than 20 ng/mL (1,2).The Endocrine  Society went on to further define vitamin Dinsufficiency as a level between 21 and 29 ng/mL  (2).1. IOM [employer] of Medicine). 2010. Dietary referenceintakes for calcium and D.   [place]: [employer].2. Holick MF, Binkley NC, Bischoff-Ferrari HA, et  al.Evaluation, treatment, and prevention of vitamin Ddeficiency: an Endocrine Society   clinical practiceguideline. JCEM. 2011 Jul; 96(7):1911-30.     30.0 - 100.0 ng/mL   Back to top of Results from Last 3 Months      Rheumatoid Factor (07/01/2014 4:52 PM)  Rheumatoid Factor (07/01/2014 4:52 PM)   Component Value Ref Range   Rheumatoid Factor 31.3 (H) 0.0 - 13.9 IU/mL   Back to top of Results from Last 3 Months      CMP14+LP+CBC/D/PLT+T4+TSH+H... (07/01/2014 4:52 PM)  CMP14+LP+CBC/D/PLT+T4+TSH+H... (07/01/2014 4:52 PM)   Component Value Ref Range   Glucose 85  Comment:   Specimen received in contact with cells. No visible hemolysispresent. However GLUC may be   decreased and K increased. Clinicalcorrelation indicated.     65 - 99 mg/dL   Hemoglobin U9W 1.1BJYNWGN: .Pre-diabetes: 5.7 - 6.4Diabetes: >6.4Glycemic control for adults with diabetes: <7.0 4.8 - 5.6 %   BUN 13 6 - 24 mg/dL   CREATININE 5.62 1.30 - 1.00 mg/dL   GFR Non-African American 92 >59 mL/min/1.73   GFR African  American 106 >59 mL/min/1.73   BUN/Creatinine Ratio 17 9 - 23   Sodium 143 134 - 144 mmol/L   Potassium 4.0 3.5 - 5.2 mmol/L   Chloride 100 97 - 108 mmol/L   CO2 23 18 - 29 mmol/L   Calcium 9.2 8.7 - 10.2 mg/dL   Total Protein 7.0 6.0 - 8.5 g/dL   Alb 4.2 3.5 - 5.5 g/dL   Globulin 2.8 1.5 - 4.5 g/dL   Albumin/Globulin Ratio 1.5 1.1 - 2.5   Total Bilirubin 0.3 0.0 - 1.2 mg/dL   Alkaline Phosphatase 74 39 - 117 IU/L   AST 18 0 - 40 IU/L   ALT 19 0 - 32 IU/L   Cholesterol, Total 207 (H) 100 - 199 mg/dL   Triglycerides 865 (H) 0 - 149 mg/dL   HDL 46  Comment:   According to ATP-III Guidelines, HDL-C >59 mg/dL is considered anegative risk factor for   CHD.     >39 mg/dL   VLDL CHOLESTEROL CALCULATED 46 (H) 5 - 40 mg/dL   LDL Calculated 784 (H) 0 - 99 mg/dL   Homocystine,Blood 69.6 (H) 0.0 - 15.0 umol/L   Thyrotropin 1.410 0.450 - 4.500 uIU/mL   T4, Total 6.6 4.5 - 12.0 ug/dL   hCG, Beta Chain, Quant, S <1  Comment:   Female (Non-pregnant)????????0 -???????? 5(Postmenopausal)????0 -???????? 8.Female (Pregnant)Weeks of   Gestation3????????????????????????????????6 -????????714???????????????????????????? 10 -???? 7505????????????????????????????217 -????[id number]????????  ????????????????????158 - [id number]???????????????????????? [id number] -[id number]????????????????????????32065 -[id number]????????????????????????63803   -  [id number]????????????????????????[id number] -[id number]????????????????????????[id number] -[id number]????????????????????????13950 - [id number]????  ????????????????????12039 - [id number]???????????????????????? 9040 - [id number]???????????????????????? 8175 - [id number]????????????????????????   8099 - 58176Roche ECLIA methodology     mIU/mL   Cortisol 10.5Comment: Cortisol AM 6.2 - 19.4Cortisol PM 2.3 - 11.9 ug/dL   Total Testosterone 18 3 - 41 ng/dL   Free Testosterone 2.2 0.0 - 4.2 pg/mL   LH 7.7  Comment:   Follicular phase????????????????2.4 -????12.6Ovulation phase????????????????14.0 -????95.6Luteal phase????????????????????  ????1.0 -????11.4Postmenopausal????????????????????7.7 -????58.5     mIU/mL   FSH 15.3  Comment:   Follicular phase????????????????3.5 -????12.5Ovulation phase???????????????? 4.7 -????21.5Luteal phase????????????????????  ????1.7 -???? 7.7Postmenopausal???????????????? 25.8 - 134.8     mIU/mL    Prolactin 92.8 (H) 4.8 - 23.3 ng/mL   Progesterone 0.3  Comment:   Follicular phase???????????? 0.2 -???? 1.5Luteal phase???????????????????? 1.7 -????27.0Ovulation phase????????????????  0.8 -???? 3.0PregnantFirst trimester???????? 8.8 -????48.6Second trimester???? 12.4 -????75.8Third   trimester????????58.5 - 222.3Postmenopausal???????????????? 0.1 -???? 0.8     ng/mL   17-Hydroxyprogesterone 16Comment: Adult FemaleFollicular 15 - 70Luteal 35 - 290 ng/dL   DHEAS (DHEA Sulfate) 118.7 41.2 - 243.7 ug/dL   Androstenedione 82 41 - 262 ng/dL   Sex Hormone Binding 95.6 17.3 - 125.0 nmol/L   Estradiol, Sensitive <5.0  Comment:   Adult Female:Follicular phase???? 12.5 -???? 166.0Ovulation phase????????85.8 -???? 498.0Luteal phase   ????????????43.8 -???? 211.0Postmenopausal???????? <6.0 -????????54.7Pregnancy1st trimester???????? 215.0 -   >4300.0Girls (1-10 years)????????6.0 -????????27.0Roche ECLIA methodologyInformation released to FDA  by different reagent manufactures hasidentified cross reactivity between Fulvestrant, a   drug used in thetreatment of metastatic breast cancer, and immunoassays; leading tofalsely   elevated estradiol results. Any patient known to be on aFulvestrant regimen can be tested   for Estradiol using [facility] assayEstradiol, Sensitive (LC/MS) test number [id number] which   does notexhibit Fulvestrant interference.     pg/mL   Insulin 16.2 2.6 - 24.9 uIU/mL   C-Peptide 4.2Comment: C-Peptide reference interval is for fasting patients. 1.1 - 4.4 ng/mL   Lactate Serum/Plasma 7.1 4.5 - 19.8 mg/dL   WBC 7.5 3.4 - 57.8 I69G2/XB   RBC 4.94 3.77 - 5.28 x10E6/uL   Hemoglobin 13.8 11.1 - 15.9 g/dL   Hematocrit 28.4 13.2 - 46.6 %   MCV 84 79 - 97 fL   MCH 27.9 26.6 - 33.0 pg   MCHC 33.3 31.5 - 35.7 g/dL   RDW 44.0 10.2 - 72.5 %   Platelets 268 150 - 379 x10E3/uL   Segs Relative 62 %   Lymphocytes Relative 30 %   Monocytes Relative 6 %   Eosinophils Relative Percent 2 %   Basophils Relative 0 %   Neutrophils Absolute 4.7 1.4 - 7.0 x10E3/uL   Lymphocytes Absolute 2.2 0.7 - 3.1 x10E3/uL   Monocytes  Absolute 0.5 0.1 - 0.9 x10E3/uL   Eosinophils Absolute 0.1 0.0 - 0.4 x10E3/uL   Basophils Absolute 0.0 0.0 - 0.2 x10E3/uL   Immature Granulocytes 0 %   Granulocyte Immature Abs 0.0 0.0 - 0.1 x10E3/uL         Prior Diagnostic Testing:  imaging       Assessment/Plan:   Joey is a 49 y.o. female with   Encounter Diagnoses   Name Primary?   ??? Abnormal immunological finding in serum Yes    Comment: ANA, ds DNA, Evaluate autoimmune disease   ??? Sicca syndrome    ??? Myofascial pain syndrome    ??? Chronic fatigue    ??? Polyarthralgia      1. Will evaluate possible connective tissue disease/aiutoimmune disease with arthritis - X rays hands  and feet; repeat inflammatory parameters, serology - at this time no definite clinical signs of SLE but might be early disease vs. Undifferentiated connective tissue disease    2. She has signs of Myofascial pain syndrome with chronic fatigue - will recheck muscle enzymes, vitamin D and review the rest of results    After the results will re evaluate in 2 weeks and decide on plan of treatment            Time Spent With Patient:  Time spent with patient:  60  minutes  Time spent discussing diagnosis, management, and treatment plan: > 25 minutes

## 2014-09-03 DIAGNOSIS — R769 Abnormal immunological finding in serum, unspecified: Secondary | ICD-10-CM

## 2014-09-04 ENCOUNTER — Other Ambulatory Visit: Admit: 2014-09-04 | Payer: PRIVATE HEALTH INSURANCE

## 2014-09-04 ENCOUNTER — Ambulatory Visit: Admit: 2014-09-04 | Payer: PRIVATE HEALTH INSURANCE

## 2014-09-04 ENCOUNTER — Ambulatory Visit: Admit: 2014-09-04 | Discharge: 2014-09-04 | Payer: PRIVATE HEALTH INSURANCE

## 2014-09-04 ENCOUNTER — Encounter

## 2014-09-04 DIAGNOSIS — R769 Abnormal immunological finding in serum, unspecified: Secondary | ICD-10-CM

## 2014-09-04 DIAGNOSIS — M109 Gout, unspecified: Secondary | ICD-10-CM

## 2014-09-04 DIAGNOSIS — M2550 Pain in unspecified joint: Secondary | ICD-10-CM

## 2014-09-04 LAB — ANTIEXTRACTABLE NUCLEAR AG
RNP Antibodies: NEGATIVE
SM Ratio: 0.08 ratio (ref 0.00–0.90)
SMRNP Ratio: 0.18 ratio (ref 0.00–0.90)
Smith Antibodies: NEGATIVE

## 2014-09-04 LAB — C3 COMPLEMENT: C3 Complement: 160 mg/dL (ref 87–200)

## 2014-09-04 LAB — RHEUMATOID FACTOR: Rheumatoid Factor: 19.6 [IU]/mL — ABNORMAL HIGH (ref 0.0–14.0)

## 2014-09-04 LAB — C-REACTIVE PROTEIN: CRP: 5.8 mg/L (ref 1.0–10.0)

## 2014-09-04 LAB — SJOGREN'S ANTIBODIES SSA & SSB,IGG
SSA (RO) Ab: NEGATIVE
SSA/RO Ratio: 0.19 ratio (ref 0.00–0.90)
SSB (LA) Ab: NEGATIVE
SSB/LA Ratio: 0.17 ratio (ref 0.00–0.90)

## 2014-09-04 LAB — CK: Total CK: 53 U/L (ref 30–223)

## 2014-09-04 LAB — CYCLIC CITRUL POLYPEP AB IGG/IGA: Cyclic Citrullin Polypeptide Ab IgG/IgA: 10 U (ref 0–19)

## 2014-09-04 LAB — C4 COMPLEMENT: C4 Complement: 23 mg/dL (ref 19–52)

## 2014-09-04 LAB — SED RATE: Sed Rate: 11 mm/h (ref 0–30)

## 2014-09-04 LAB — URIC ACID: Uric Acid: 4.2 mg/dL (ref 3.8–8.7)

## 2014-09-04 LAB — VITAMIN D 25 HYDROXY: Vit D, 25-Hydroxy: 29.7 ng/mL — ABNORMAL LOW (ref 30.0–100)

## 2014-09-04 LAB — SCL-70 ANTIBODY
SCL70 Ratio: 0.14 ratio (ref 0.00–0.90)
Scleroderma SCL-70: NEGATIVE

## 2014-09-04 NOTE — Unmapped (Signed)
1. Will evaluate possible connective tissue disease/aiutoimmune disease with arthritis - X rays hands and feet; repeat inflammatory parameters, serology - at this time no definite clinical signs of SLE but might be early disease vs. Undifferentiated connective tissue disease    2. You have signs of Myofascial pain syndrome with chronic fatigue - will recheck muscle enzymes, vitamin D and review the rest of results    After the results will re evaluate in 2 weeks and decide on plan of treatment

## 2014-09-05 NOTE — Unmapped (Signed)
PT HAS BEEN NOTIFIED    SHE IS TAKING 50,000 UNITS WEEKLY

## 2014-09-05 NOTE — Unmapped (Signed)
Left message for patient to call

## 2014-09-05 NOTE — Unmapped (Signed)
-----   Message from Theodoro Clock, MD sent at 09/04/2014 10:25 PM EDT -----  No inflammation, normal muscle enzymes, low titer positive Rheumatoid factor

## 2014-09-05 NOTE — Unmapped (Signed)
-----   Message from Theodoro Clock, MD sent at 09/05/2014 11:54 AM EDT -----  Multiple antibodies for different connective tissue diseases Scleroderma, Sjogren's, Mixed connective tissue disease are negative, vitamin D is borderline low; how much is she taking?

## 2014-09-05 NOTE — Unmapped (Signed)
Take 2 weeks twice a week and return to weekly, take with a meal not just with water

## 2014-09-08 NOTE — Unmapped (Signed)
-----   Message from Theodoro Clock, MD sent at 09/06/2014  9:36 AM EDT -----  Though screen RF positive the specific anti CCP negative, multiple antibodies for connective tissue diseases negative, some still pending

## 2014-09-08 NOTE — Unmapped (Signed)
Pt has been notified of results

## 2014-09-08 NOTE — Unmapped (Signed)
Pt has been notified

## 2014-09-16 NOTE — Unmapped (Signed)
Subjective  HPI:   Patient ID: Nicole Byrd is a 49 y.o. Caucasian female.    Chief Complaint:  HPI    No chief complaint on file.      Mrs. Bains comes in follow up for   Encounter Diagnoses   Name Primary?   ??? Sicca syndrome Yes   ??? Polyarthralgia    ??? Myofascial pain syndrome    ??? Chronic fatigue    ??? Abnormal immunological finding in serum      HISTORY:    She was referred for Lupus evaluation/autoimmune disease     Main complaint today is fatigue. Not sleeping well but no history of sleep apnea. Has hand pain which started almost 9 months ago. Am stiffness 1 h in am. Pain is more in am after rest. Has feet pain which started later 7 month ago. Has muscle pain and cramps.   Has severe low back pain. Hips tender to touch.  Skin tender and muscle tender.   Has headaches.   Has rosacea but not sure about other antibodies.   Some photosensitivity.   No Raynaud's but dorsum of hand is white in cold.     INTERVAL HISTORY:            Histories:     Past Medical History   Diagnosis Date   ??? Neck pain 06/01/10   ??? History of pituitary tumor    ??? Essential hypertension, benign    ??? GERD (gastroesophageal reflux disease)    ??? Chicken pox    ??? Mumps    ??? Shingles    ??? Anxiety    ??? Night sweat    ??? Hypertension    ??? Hypercholesteremia    ??? Aching headache    ??? Hypoprolactinemia        Social History     Social History   ??? Marital Status: Married     Spouse Name: N/A   ??? Number of Children: N/A   ??? Years of Education: N/A     Social History Main Topics   ??? Smoking status: Never Smoker    ??? Smokeless tobacco: Never Used   ??? Alcohol Use: 0.6 oz/week     1 Glasses of wine per week   ??? Drug Use: Not on file   ??? Sexual Activity: Not on file     Other Topics Concern   ??? Not on file     Social History Narrative       Family History   Problem Relation Age of Onset   ??? Cancer Mother    ??? Hypertension Mother    ??? Diabetes Mother    ??? Anuerysm Mother    ??? Clotting disorder Mother    ??? Broken bones Maternal Grandmother          ROS:    Review of Systems   Constitutional: Positive for fatigue. Negative for chills, activity change and appetite change.   HENT: Negative for facial swelling.         Dry mouth     Eyes: Negative for pain, discharge and itching.        Dry eyes   Respiratory: Negative for apnea, choking and chest tightness.    Cardiovascular: Negative for chest pain, palpitations and leg swelling.   Gastrointestinal: Positive for heartburn and diarrhea. Negative for abdominal pain, abdominal distention and anal bleeding.   Genitourinary: Negative for dysuria, difficulty urinating and dyspareunia.   Musculoskeletal: Positive for myalgias, arthralgias, neck  pain and neck stiffness.   Skin: Positive for color change and rash. Negative for pallor.   Neurological: Positive for weakness. Negative for dizziness, facial asymmetry and headaches.   Hematological: Negative for adenopathy. Does not bruise/bleed easily.   Psychiatric/Behavioral: Negative for behavioral problems, confusion and agitation.       Objective:   Physical Exam   Constitutional: She is oriented to person, place, and time. She appears well-developed and well-nourished. No distress.   HENT:   Head: Normocephalic and atraumatic.   Right Ear: External ear normal.   Left Ear: External ear normal.   Nose: Nose normal.   Mouth/Throat: Oropharynx is clear and moist. No oropharyngeal exudate.   Eyes: Conjunctivae and EOM are normal. Pupils are equal, round, and reactive to light. Right eye exhibits no discharge. Left eye exhibits no discharge. No scleral icterus.   Neck: Normal range of motion. Neck supple. No JVD present. No tracheal deviation present. No thyromegaly present.   Cardiovascular: Normal rate and normal heart sounds.  Exam reveals no gallop.    No murmur heard.  Pulmonary/Chest: Effort normal and breath sounds normal. She has no wheezes. She has no rales. She exhibits no tenderness.   Abdominal: Soft. Bowel sounds are normal. She exhibits no distension. There is no  tenderness. There is no rebound and no guarding.   Musculoskeletal: Normal range of motion. She exhibits no edema or tenderness.   Tender CMC thumbs  Tender MCPs and PIPs  Tender ankles and MTP  Minimal swelling MCPs and ankles  Tender points 14/18   Lymphadenopathy:     She has no cervical adenopathy.   Neurological: She is alert and oriented to person, place, and time. No cranial nerve deficit. She exhibits normal muscle tone. Coordination normal.   Skin: Skin is warm and dry. No rash noted. She is not diaphoretic. No erythema. No pallor.   Psychiatric: She has a normal mood and affect. Her behavior is normal. Judgment and thought content normal.       Medications:     Current outpatient prescriptions:   ???  albuterol (PROVENTIL) 2.5 mg /3 mL (0.083 %) nebulizer solution, Inhale into the lungs., Disp: , Rfl:   ???  albuterol (PROVENTIL;VENTOLIN;PROAIR) 90 mcg/actuation inhaler, Inhale 2 puffs into the lungs every 6 hours as needed for Wheezing, Disp: , Rfl:   ???  amLODIPine (NORVASC) 5 MG tablet, Take 5 mg by mouth daily., Disp: , Rfl:   ???  aspirin 81 MG chewable tablet, Take 81 mg by mouth daily Chew 1 tablet(s) every day by oral route., Disp: , Rfl:   ???  bromocriptine (PARLODEL) 2.5 mg tablet, Take 1 tablet by mouth daily, Disp: , Rfl:   ???  cabergoline (DOSTINEX) 0.5 mg tablet, Take 0.5 mg by mouth Twice a Week cabergoline 0.5 mg tablet, Disp: , Rfl:   ???  ergocalciferol (ERGOCALCIFEROL) 50,000 unit capsule, Take 1 capsule by mouth once a week, Disp: , Rfl:   ???  esomeprazole (NEXIUM) 40 MG capsule, Take 40 mg by mouth every morning (before breakfast) , Disp: , Rfl:   ???  febuxostat (ULORIC) 40 mg, Take 40 mg by mouth daily., Disp: , Rfl:   ???  fexofenadine (ALLEGRA) 60 MG tablet, Take by mouth., Disp: , Rfl:   ???  fluticasone (FLONASE) 50 mcg/actuation nasal spray, 2 sprays by Nasal route daily , Disp: , Rfl:   ???  hydrochlorothiazide (HYDRODIURIL) 25 MG tablet, Take 25 mg by mouth daily hydrochlorothiazide 25 mg tablet,  Disp: , Rfl:   ???  ibuprofen (ADVIL,MOTRIN) 800 MG tablet, Take by mouth., Disp: , Rfl:   ???  levomefol-B6-meB12-algal oil (METANX/FOLTANX) 3 mg-35 mg-2 mg -90.314 mg Cap, , Disp: , Rfl:   ???  methylPREDNISolone (MEDROL DOSEPACK) 4 mg tablet, Take as directed, Disp: , Rfl:   ???  metoprolol succinate (TOPROL-XL) 50 MG 24 hr tablet, Take 50 mg by mouth daily metoprolol succinate ER 50 mg tablet,extended release 24 hr, Disp: , Rfl:     Allergies:     Allergies   Allergen Reactions   ??? Iodinated Contrast Media - Iv Dye    ??? Iohexol Hives   ??? Lisinopril-Hydrochlorothiazide    ??? Sulfa (Sulfonamide Antibiotics) Hives       Lab Review:   Appointment on 09/04/2014   Component Date Value   ??? Uric Acid 09/04/2014 4.2    Appointment on 09/04/2014   Component Date Value   ??? CRP 09/04/2014 5.8    ??? Total CK 09/04/2014 53    ??? Cyclic Citrullin Polypep* 09/04/2014 10    ??? Scleroderma SCL-70 09/04/2014 Negative    ??? SCL70 Ratio 09/04/2014 0.14    ??? Sed Rate 09/04/2014 11    ??? SSA (RO) Ab 09/04/2014 Negative    ??? SSA/RO Ratio 09/04/2014 0.19    ??? SSB (LA) Ab 09/04/2014 Negative    ??? SSB/LA Ratio 09/04/2014 0.17    ??? SM Antibody 09/04/2014 Negative    ??? SM Ratio 09/04/2014 0.08    ??? SM/ RNP AB 09/04/2014 Negative    ??? SMRNP Ratio 09/04/2014 0.18    ??? Vit D, 25-Hydroxy 09/04/2014 29.7*   ??? Rheumatoid Factor 09/04/2014 19.6*   ??? C3 Complement 09/04/2014 160    ??? C4 Complement 09/04/2014 23          Sed Rate 17 0 - 40 mm/hr   Back to top of Results from Last 3 Months      PAI-1+LP(A)+PAI-1 POLYMORPHISM (07/01/2014 4:52 PM)  PAI-1+LP(A)+PAI-1 POLYMORPHISM (07/01/2014 4:52 PM)   Component Value Ref Range   Lipoprotein (a) <3  Comment:   The mean and median Lp(a) concentration of theAfrican-American population is approximately   twice that ofthe Caucasian population, although studies have shown thatelevated Lp(a)   concentration is not an independent riskfactor for developing atherosclerotic disease in   theAfrican-American population.Reference  Range:<31     mg/dL   Plasminogen Act Inhibitor-1 CANCELED  Comment:   Test not performedTEST NOT PERFORMEDPlasminogen Activator Inhibitor-1 (PAI-1 activity)  testingcannot be performed because the manufacturer of PAI-1activity kits has taken the kit  off of the market andrecalled all previously distributed kits.Reference Range:<31.1    Result canceled by the ancillary     IU/mL   PAI-1 Locus 4G/5G Polymorphism Comment  Comment:   Patient DNA was evaluated for the PAI-1 4G/5G promoterpolymorphism, which is a single base   pair guanine (4G/5G)deletion/insertion polymorphism, using polymerase chainreaction (PCR)   technology and restriction fragment lengthpolymorphism (RFLP).     ??   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 ??   Interpretation: Comment  Comment:   This individual has two copies of the 5G allele, also knownas the 5G/5G genotype of the   plasminogen activatorinhibitor type 1 (PAI-1) gene. The 5G/5G genotype isassociated with the  lowest PAI-1 activity and antigenlevels compared to those individuals that have either   the4G/4G or 4G/5G genotype. Elevated PAI-1 levels areassociated with an increased risk of   coronary arterydisease, venous thromboembolic  disease and possiblycomplications of pregnancy  such as recurrent abortion.     ??   Comments: Comment  Comment:   Simultaneous Risks: If a patient possesses two or morecongenital or acquired risk factors,   the risk of diseasemay rise to more than the sum of the risk ratios for theindividual risk   factors. For instance, a combination of the4G/4G genotype and the insulin resistance   syndrome mayconfer an increase in cardiovascular disease risk over thatconferred by the   presence of an isolated PAI-1 4G/4Gpolymorphism.Recommendations for Genetic Counseling: The   PAI-1 4G alleleis an inherited characteristic. If the polymorphism ispresent in a   heterozygous or homozygous fashion, werecommend that the patient and  their family   considergenetic counseling to obtain additional information oninheritance and to identify   other family members at risk.Testing Characteristics: Genetic testing by PCR   providesexceptionally high sensitivity and specificity. Incorrectgenotyping results can be   caused by rare polymorphisms inprimer binding sites and to misidentification of specimensby   collectors or laboratory personnel. This assay analyzesonly the PAI 4G/5G locus and does not  measure geneticabnormalities elsewhere in the genome.This test was developed and its   performance characteristicsdetermined by [employer]. It has not been cleared or approvedby the   Food and Drug Administration.References:Barcellona D. Thromb Haemost.   2003;90:1061.;Dossenbach-Glaninger. Clin Chem. 1610;96:0454.; Evonnie Dawes. NEJM.   2000;342:1792.; Margaglione M et al. ArteriosclThromb and Vasc Bio. [id number].   ??   Back to top of Results from Last 3 Months      REQUEST PROBLEM (07/01/2014 4:52 PM)  REQUEST PROBLEM (07/01/2014 4:52 PM)   Component Value Ref Range   Request Problem Comment  Comment:   No specimen received.TEST: [id number]???? PANEL CORTISOL, ACTH STIMULATIONCONTACTED: CHRIS H.   (REGISTRAR) AT YOUR FACILITY ON 07-04-2014     ??     REQUEST PROBLEM (07/01/2014 4:52 PM)   Narrative   Performed at: [facility]*[address]*[place] [phone]??   Lab Director: Annamarie Major PhD??   Performed at: [facility] [place]*[address]*[place] [phone]??   Lab Director: Mila Homer MD??   Performed at: [facility]*[address]*[place] ??   [phone]??   Lab Director: Nonda Lou MD??    Back to top of Results from Last 3 Months      Methylmalonic Acid, Serum (07/01/2014 4:52 PM)  Methylmalonic Acid, Serum (07/01/2014 4:52 PM)   Component Value Ref Range   Methylmalonic Acid, SERUM 121 0 - 378 nmol/L   Back to top of Results from Last 3 Months      Vitamin B6  (07/01/2014 4:52 PM)  Vitamin B6 (07/01/2014 4:52 PM)   Component Value Ref Range   Vitamin B6, Plasma 7.2 2.0 - 32.8 ug/L   Back to top of Results from Last 3 Months      ANA COMPREHENSIVE (07/01/2014 4:52 PM)  ANA COMPREHENSIVE (07/01/2014 4:52 PM)   Component Value Ref Range   dsDNA Ab 18 (H)Comment: Negative <5Equivocal 5 - 9Positive >9 0 - 9 IU/mL   RIBONUCLEOPROTEIN EXTRACTABLE <0.2 0.0 - 0.9 AI   SM AB <0.2 0.0 - 0.9 AI   SCL-70 EXTRACTABLE NUCLEAR AB 2.2 (H) 0.0 - 0.9 AI   ENA SSA (RO) Ab <0.2 0.0 - 0.9 AI   ENA SSB (LA) Ab <0.2 0.0 - 0.9 AI   CHROMATIN ANTIBODY 0.2 0.0 - 0.9 AI   JO-1 EXTRACTABLE NUCLEAR ANTIBODY <0.2 0.0 - 0.9 AI   CENTROMERE PROTEIN B ANTIBODY <0.2  0.0 - 0.9 AI   See below: Comment  Comment:   Autoantibody???????????????????????????????????????????? Disease   Association------------------------------------------------------------Condition????????????????????????  ????????????Frequency---------------------???? ------------------------???? ---------Antinuclear   Antibody,????????SLE, mixed connectiveDirect (ANA-D)???????????????????? tissue   diseases---------------------???? ------------------------???? ---------dsDNA????????????????????????????????????   SLE????????????????????????????????????????????????40 - 60%---------------------???? ------------------------????   ---------Chromatin????????????????????????????????Drug induced SLE????????????????????????????????90%SLE????????????????????????????????????????  ????????48 - 97%---------------------???? ------------------------???? ---------SSA (Ro)????????????????????????  ???????? SLE????????????????????????????????????????????????25 - 35%Sjogren's Syndrome???????????????? 40 - 70%Neonatal Lupus????   ????????????????????????????100%---------------------???? ------------------------???? ---------SSB (La)????????????  ???????????????????? SLE???????????????????????????????????????????????????????? 10%Sjogren's Syndrome????????????????????????????  30%---------------------???? -----------------------????????---------Sm (anti-Smith)????????????????????SLE   ???????????????????????????????????????????? 15 - 30%---------------------???? -----------------------????????  ---------RNP????????????????????????????????????????????Mixed Connective TissueDisease????????????????????????????????????????????????   95%(U1  nRNP,????????????????????????????????SLE????????????????????????????????????????????????30 - 50%anti-ribonucleoprotein)????  Polymyositis and/orDermatomyositis???????????????????????????????? 20%---------------------????   ------------------------???? ---------Scl-70 (antiDNA????????????????????Scleroderma (diffuse)????????????20 -  35%topoisomerase)???????????????????? Crest???????????????????????????????????????????????????? 13%---------------------????   ------------------------???? ---------Jo-1???????????????????????????????????????? Polymyositis   and/orDermatomyositis????????????????????????20 - 40%---------------------???? ------------------------????   ---------Centromere B???????????????????????? Scleroderma - Crestvariant???????????????????????????????????????????????? 80%   ??   Back to top of Results from Last 3 Months      Vitamin B12 (07/01/2014 4:52 PM)  Vitamin B12 (07/01/2014 4:52 PM)   Component Value Ref Range   Vitamin B-12 592 211 - 946 pg/mL   Back to top of Results from Last 3 Months      Uric Acid (07/01/2014 4:52 PM)  Uric Acid (07/01/2014 4:52 PM)   Component Value Ref Range   Uric Acid, Serum 9.9 (H)Comment: Therapeutic target for gout patients: <6.0 2.5 - 7.1 mg/dL   Back to top of Results from Last 3 Months      C-Reactive Protein (07/01/2014 4:52 PM)  C-Reactive Protein (07/01/2014 4:52 PM)   Component Value Ref Range   wr-CRP 4.0 0.0 - 4.9 mg/L   Back to top of Results from Last 3 Months      Vitamin D 25 Hydroxy (07/01/2014 4:52 PM)  Vitamin D 25 Hydroxy (07/01/2014 4:52 PM)   Component Value Ref Range   Vit D, 25-Hydroxy 21.5 (L)  Comment:   Vitamin D deficiency has been defined by the Institute ofMedicine and an Endocrine Society   practice guideline as alevel of serum 25-Wilson vitamin D less than 20 ng/mL (1,2).The Endocrine  Society went on to further define vitamin Dinsufficiency as a level between 21 and 29 ng/mL  (2).1. IOM [employer] of Medicine). 2010. Dietary referenceintakes for calcium and D.   [place]: [employer].2. Holick MF, Binkley NC, Bischoff-Ferrari HA, et  al.Evaluation, treatment, and prevention of vitamin Ddeficiency: an  Endocrine Society   clinical practiceguideline. JCEM. 2011 Jul; 96(7):1911-30.     30.0 - 100.0 ng/mL   Back to top of Results from Last 3 Months      Rheumatoid Factor (07/01/2014 4:52 PM)  Rheumatoid Factor (07/01/2014 4:52 PM)   Component Value Ref Range   Rheumatoid Factor 31.3 (H) 0.0 - 13.9 IU/mL   Back to top of Results from Last 3 Months      CMP14+LP+CBC/D/PLT+T4+TSH+H... (07/01/2014 4:52 PM)  CMP14+LP+CBC/D/PLT+T4+TSH+H... (07/01/2014 4:52 PM)   Component Value Ref Range   Glucose 85  Comment:   Specimen received in contact with cells. No visible hemolysispresent. However GLUC may be   decreased and K increased. Clinicalcorrelation indicated.     65 - 99 mg/dL   Hemoglobin Z6X [id number]: .Pre-diabetes: 5.7 - 6.4Diabetes: >6.4Glycemic control for adults with diabetes: <7.0 4.8 - 5.6 %   BUN 13 6 - 24 mg/dL   CREATININE 8.11 9.14 - 1.00 mg/dL   GFR Non-African American 92 >59 mL/min/1.73   GFR African American 106 >59 mL/min/1.73   BUN/Creatinine Ratio 17 9 - 23   Sodium 143 134 - 144 mmol/L   Potassium 4.0 3.5 -  5.2 mmol/L   Chloride 100 97 - 108 mmol/L   CO2 23 18 - 29 mmol/L   Calcium 9.2 8.7 - 10.2 mg/dL   Total Protein 7.0 6.0 - 8.5 g/dL   Alb 4.2 3.5 - 5.5 g/dL   Globulin 2.8 1.5 - 4.5 g/dL   Albumin/Globulin Ratio 1.5 1.1 - 2.5   Total Bilirubin 0.3 0.0 - 1.2 mg/dL   Alkaline Phosphatase 74 39 - 117 IU/L   AST 18 0 - 40 IU/L   ALT 19 0 - 32 IU/L   Cholesterol, Total 207 (H) 100 - 199 mg/dL   Triglycerides 161 (H) 0 - 149 mg/dL   HDL 46  Comment:   According to ATP-III Guidelines, HDL-C >59 mg/dL is considered anegative risk factor for   CHD.     >39 mg/dL   VLDL CHOLESTEROL CALCULATED 46 (H) 5 - 40 mg/dL   LDL Calculated 096 (H) 0 - 99 mg/dL   Homocystine,Blood 04.5 (H) 0.0 - 15.0 umol/L   Thyrotropin 1.410 0.450 - 4.500 uIU/mL   T4, Total 6.6 4.5 - 12.0 ug/dL   hCG, Beta Chain, Quant, S <1  Comment:   Female (Non-pregnant)????????0 -???????? 5(Postmenopausal)????0 -???????? 8.Female (Pregnant)Weeks of    Gestation3????????????????????????????????6 -????????714???????????????????????????? 10 -???? 7505????????????????????????????217 -????[id number]????????  ????????????????????158 - [id number]???????????????????????? [id number] -[id number]????????????????????????32065 -[id number]????????????????????????[id number]   -[id number]????????????????????????[id number] -[id number]????????????????????????[id number] -[id number]????????????????????????13950 - [id number]????  ????????????????????12039 - [id number]???????????????????????? 9040 - [id number]???????????????????????? 8175 - [id number]????????????????????????   8099 - 58176Roche ECLIA methodology     mIU/mL   Cortisol 10.5Comment: Cortisol AM 6.2 - 19.4Cortisol PM 2.3 - 11.9 ug/dL   Total Testosterone 18 3 - 41 ng/dL   Free Testosterone 2.2 0.0 - 4.2 pg/mL   LH 7.7  Comment:   Follicular phase????????????????2.4 -????12.6Ovulation phase????????????????14.0 -????95.6Luteal phase????????????????????  ????1.0 -????11.4Postmenopausal????????????????????7.7 -????58.5     mIU/mL   FSH 15.3  Comment:   Follicular phase????????????????3.5 -????12.5Ovulation phase???????????????? 4.7 -????21.5Luteal phase????????????????????  ????1.7 -???? 7.7Postmenopausal???????????????? 25.8 - 134.8     mIU/mL   Prolactin 92.8 (H) 4.8 - 23.3 ng/mL   Progesterone 0.3  Comment:   Follicular phase???????????? 0.2 -???? 1.5Luteal phase???????????????????? 1.7 -????27.0Ovulation phase????????????????  0.8 -???? 3.0PregnantFirst trimester???????? 8.8 -????48.6Second trimester???? 12.4 -????75.8Third   trimester????????58.5 - 222.3Postmenopausal???????????????? 0.1 -???? 0.8     ng/mL   17-Hydroxyprogesterone 16Comment: Adult FemaleFollicular 15 - 70Luteal 35 - 290 ng/dL   DHEAS (DHEA Sulfate) 118.7 41.2 - 243.7 ug/dL   Androstenedione 82 41 - 262 ng/dL   Sex Hormone Binding 29.5 17.3 - 125.0 nmol/L   Estradiol, Sensitive <5.0  Comment:   Adult Female:Follicular phase???? 12.5 -???? 166.0Ovulation phase????????85.8 -???? 498.0Luteal phase   ????????????43.8 -???? 211.0Postmenopausal???????? <6.0 -????????54.7Pregnancy1st trimester???????? 215.0 -   >4300.0Girls (1-10 years)????????6.0 -????????27.0Roche ECLIA methodologyInformation released to FDA  by different reagent manufactures hasidentified cross reactivity between Fulvestrant, a   drug used in thetreatment of metastatic breast cancer, and immunoassays;  leading tofalsely   elevated estradiol results. Any patient known to be on aFulvestrant regimen can be tested   for Estradiol using [facility] assayEstradiol, Sensitive (LC/MS) test number [id number] which   does notexhibit Fulvestrant interference.     pg/mL   Insulin 16.2 2.6 - 24.9 uIU/mL   C-Peptide 4.2Comment: C-Peptide reference interval is for fasting patients. 1.1 - 4.4 ng/mL   Lactate Serum/Plasma 7.1 4.5 - 19.8 mg/dL   WBC 7.5 3.4 - 44.0 N02V2/ZD   RBC 4.94 3.77 - 5.28 x10E6/uL   Hemoglobin 13.8 11.1 - 15.9 g/dL   Hematocrit 66.4 40.3 - 46.6 %   MCV 84  79 - 97 fL   MCH 27.9 26.6 - 33.0 pg   MCHC 33.3 31.5 - 35.7 g/dL   RDW 14.7 82.9 - 56.2 %   Platelets 268 150 - 379 x10E3/uL   Segs Relative 62 %   Lymphocytes Relative 30 %   Monocytes Relative 6 %   Eosinophils Relative Percent 2 %   Basophils Relative 0 %   Neutrophils Absolute 4.7 1.4 - 7.0 x10E3/uL   Lymphocytes Absolute 2.2 0.7 - 3.1 x10E3/uL   Monocytes Absolute 0.5 0.1 - 0.9 x10E3/uL   Eosinophils Absolute 0.1 0.0 - 0.4 x10E3/uL   Basophils Absolute 0.0 0.0 - 0.2 x10E3/uL   Immature Granulocytes 0 %   Granulocyte Immature Abs 0.0 0.0 - 0.1 x10E3/uL         Prior Diagnostic Testing:  imaging       Assessment/Plan:   Elajah is a 49 y.o. female with   Encounter Diagnoses   Name Primary?   ??? Sicca syndrome Yes   ??? Polyarthralgia    ??? Myofascial pain syndrome    ??? Chronic fatigue    ??? Abnormal immunological finding in serum      1. Will evaluate possible connective tissue disease/aiutoimmune disease with arthritis - X rays hands and feet; repeat inflammatory parameters, serology - at this time no definite clinical signs of SLE but might be early disease vs. Undifferentiated connective tissue disease    2. She has signs of Myofascial pain syndrome with chronic fatigue - will recheck muscle enzymes, vitamin D and review the rest of results    After the results will re evaluate in 2 weeks and decide on plan of treatment            Time Spent With Patient:  Time  spent with patient:  0  minutes  Time spent discussing diagnosis, management, and treatment plan: > 0 minutes

## 2014-09-22 ENCOUNTER — Ambulatory Visit: Payer: PRIVATE HEALTH INSURANCE

## 2014-10-01 MED ORDER — METANX 3-35-2 MG PO TABS
3-35-2 MG | ORAL_TABLET | Freq: Every day | ORAL | 2 refills | Status: AC
Start: 2014-10-01 — End: ?

## 2014-10-01 MED ORDER — VITAMIN D (ERGOCALCIFEROL) 1.25 MG (50000 UT) PO CAPS
1.25 MG (50000 UT) | ORAL_CAPSULE | ORAL | 2 refills | Status: AC
Start: 2014-10-01 — End: ?

## 2014-11-27 ENCOUNTER — Encounter: Attending: "Endocrinology

## 2014-11-28 ENCOUNTER — Encounter: Attending: "Endocrinology

## 2016-06-08 IMAGING — CT CT ABDOMEN PELVIS W/ UROGRAM
2 of 6 series · 15 of 46 positions shown, 17 images · IV contrast (APPLIED)
Comparison: There are no previous exams available for comparison.

CT ABDOMEN PELVIS W/ UROGRAM, 06/08/2016 [DATE]:
CLINICAL INDICATION:  Renal cyst. Follow-up right kidney. Discomfort pelvic
area. History of hernia 03/09/2006.
A search for DICOM formatted images was conducted for prior CT imaging studies
completed at a non-affiliated media free facility.
TECHNIQUE: The region of interest was scanned without and with 100 cc of
Ksovue-888 IV contrast on a high resolution CT scanner using dose reduction
techniques.  Routine MPR reconstructions were performed.

[Series 5: abd/pel ax w · axial · 0.98mm/px · z∈[-496,-74]mm · 12 of 167 slices shown, 14 images]
[im 13/167  soft-tissue]
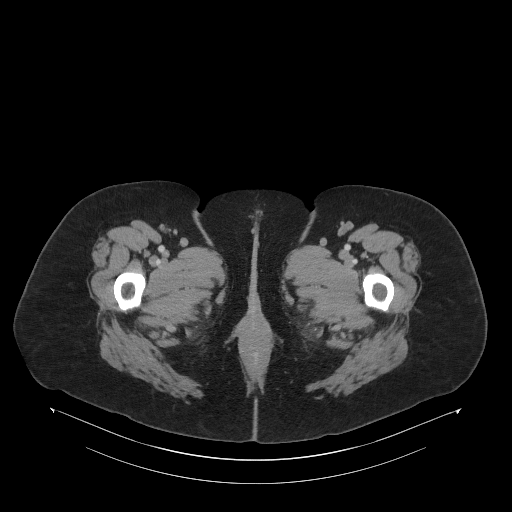
[im 13/167  bone]
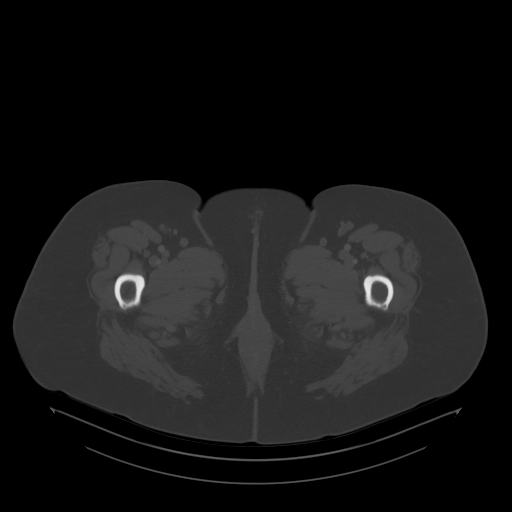
[im 26/167  soft-tissue]
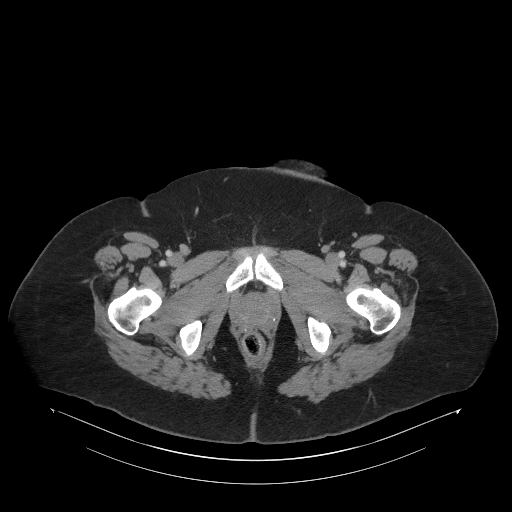
[im 39/167  soft-tissue]
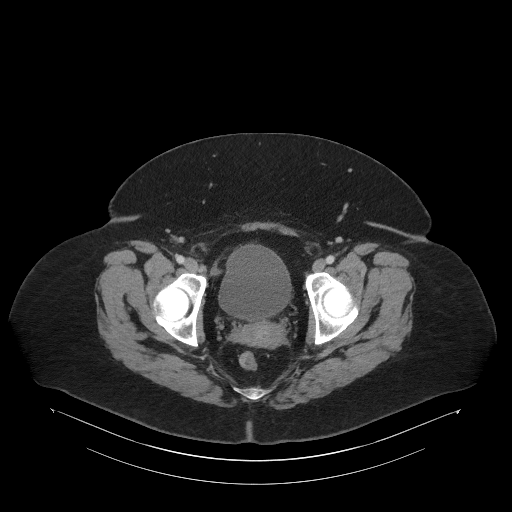
[im 52/167  soft-tissue]
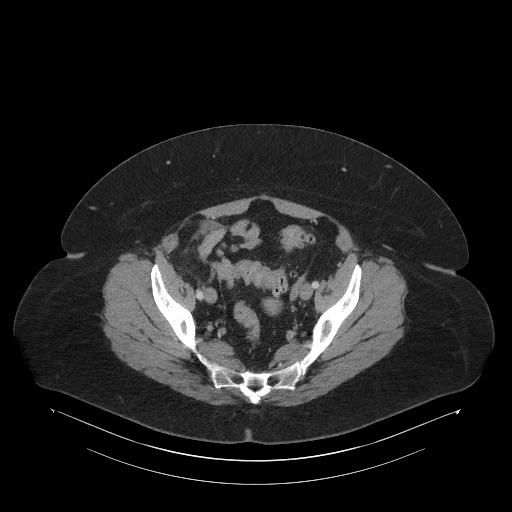
[im 64/167  soft-tissue]
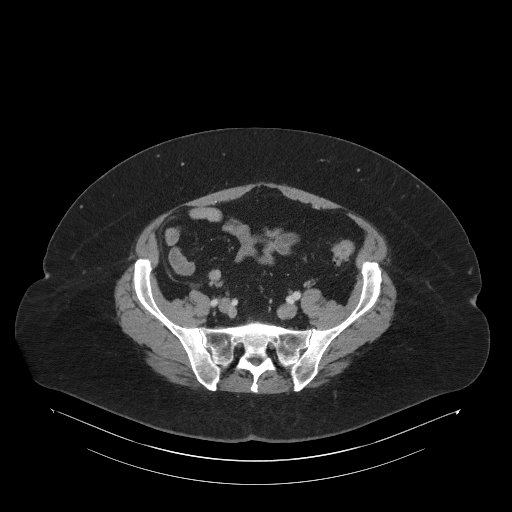
[im 77/167  soft-tissue]
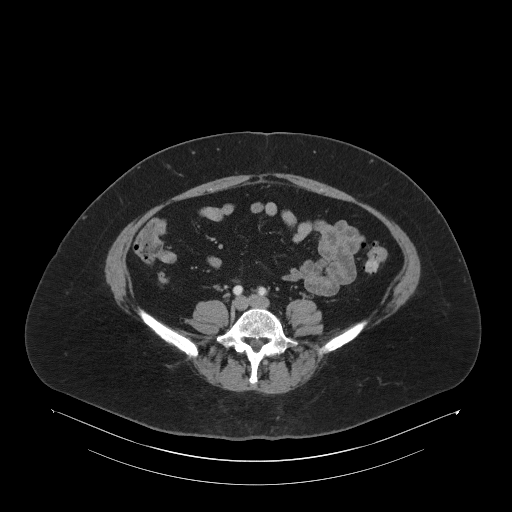
[im 90/167  soft-tissue]
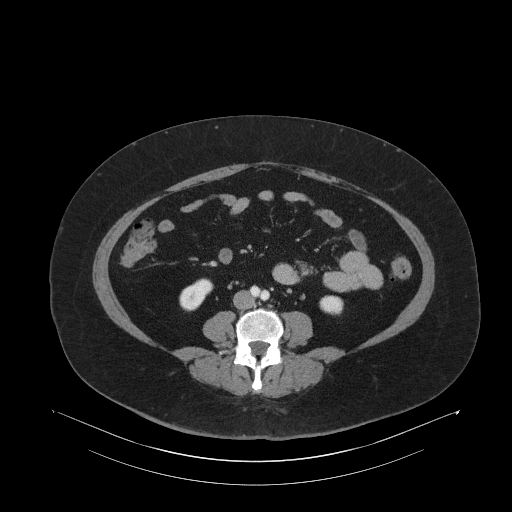
[im 103/167  soft-tissue]
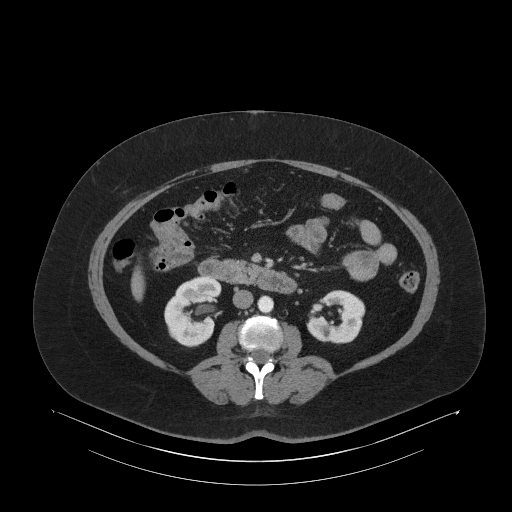
[im 115/167  soft-tissue]
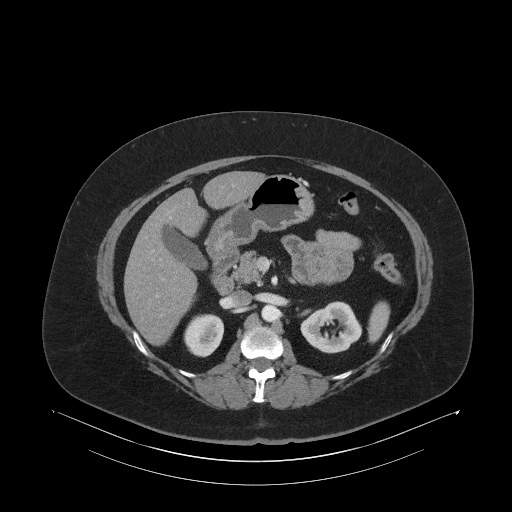
[im 115/167  bone]
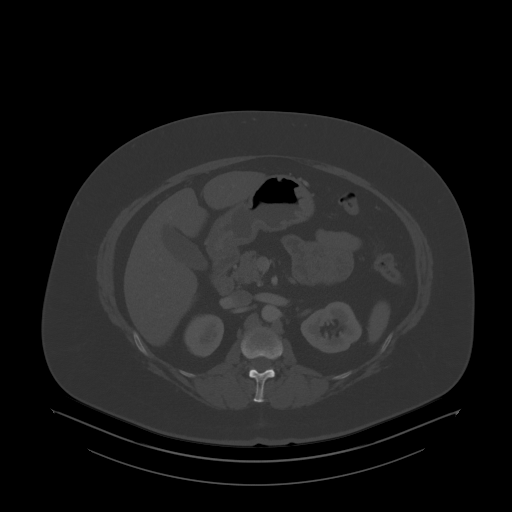
[im 128/167  soft-tissue]
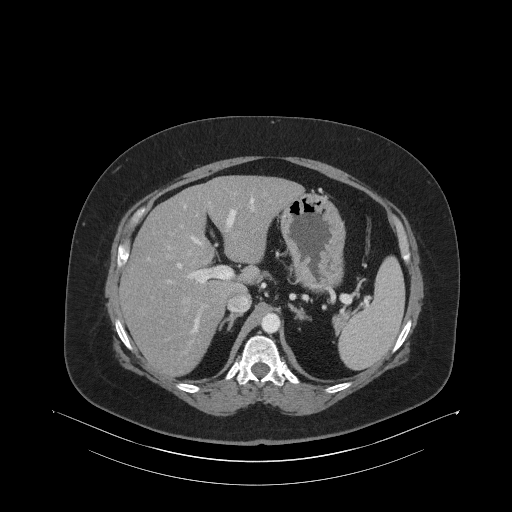
[im 141/167  soft-tissue]
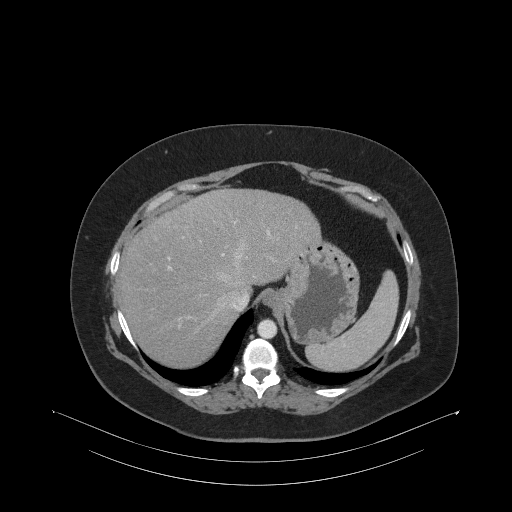
[im 154/167  soft-tissue]
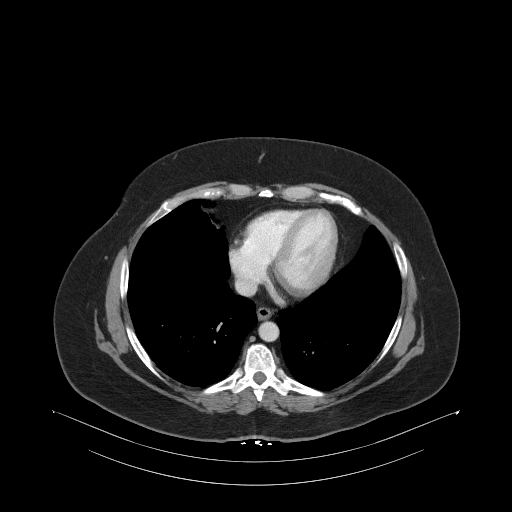

[Series 6: abd/pel cor w · coronal · 0.97mm/px · 3 of 153 slices shown]
[im 39/153  soft-tissue]
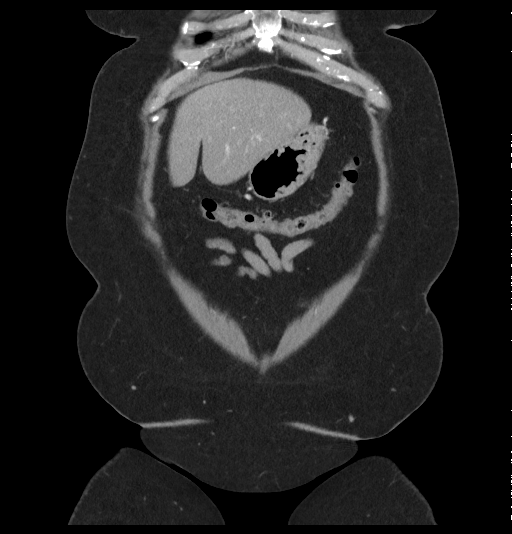
[im 77/153  soft-tissue]
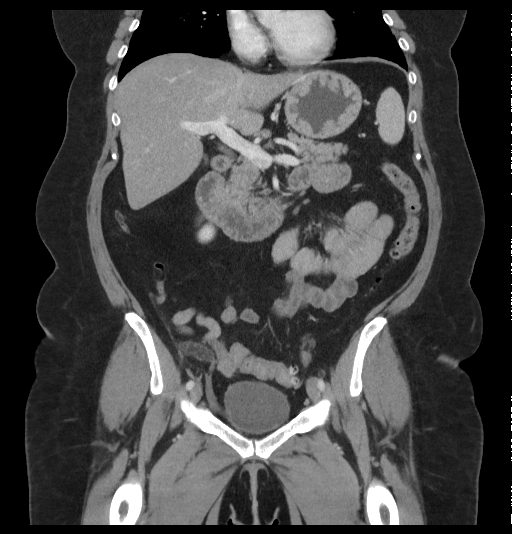
[im 115/153  soft-tissue]
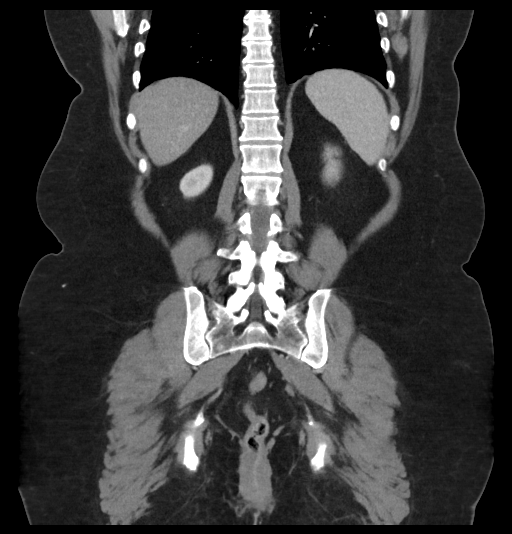

[15 of 46 positions shown; findings below may reference images not displayed]

Specifically,
the prior CTs January 2016 and February 2016 are unavailable for review.
Patient is to see Dr. Jumper on 06/09/2016. Exam is read without comparison.
FINDINGS: UROGRAM: No renal, ureteral or bladder stone. There are symmetric bilateral
nephrograms. There are subcentimeter left renal cysts, one of which contains
subtle high density on the precontrast images and may represent a hemorrhagic
cyst. There are symmetric excretory phase with contrast within the renal
pelvis,
ureters and bladder. No intraluminal filling defect.
CHEST: Included portions of the lower lungs are clear.
ABDOMEN/PELVIS: Mild diffuse low-attenuation throughout the liver compatible
with hepatic steatosis. Subcentimeter simple cyst within the left hepatic
lobe.
The liver and spleen are normal in size. The gallbladder, pancreas and adrenal
glands are negative. Colonic diverticula without acute diverticulitis. The
appendix is well-visualized and is negative.
MUSCULOSKELETAL: Mild spondylotic changes visualized spine.
IMPRESSION: 1.  Left renal cysts, one of which slightly hyperdense, this may represent a
hemorrhagic cyst.
2.  Simple subcentimeter hepatic cyst.
3.  Colonic diverticula.
RADIATION DOSE REDUCTION: All CT scans are performed using radiation dose
reduction techniques, when applicable.  Technical factors are evaluated and
adjusted to ensure appropriate moderation of exposure.  Automated dose
management technology is applied to adjust the radiation doses to minimize
exposure while achieving diagnostic quality images.

## 2016-06-08 IMAGING — DX ABDOMEN 1 VIEW
1 series · 1 of 1 positions shown · non-contrast
Comparison: None

ABDOMEN 1 VIEW, 06/08/2016 [DATE]:
CLINICAL INDICATION: Mass right kidney, possible cyst, pelvic pain

[AP]
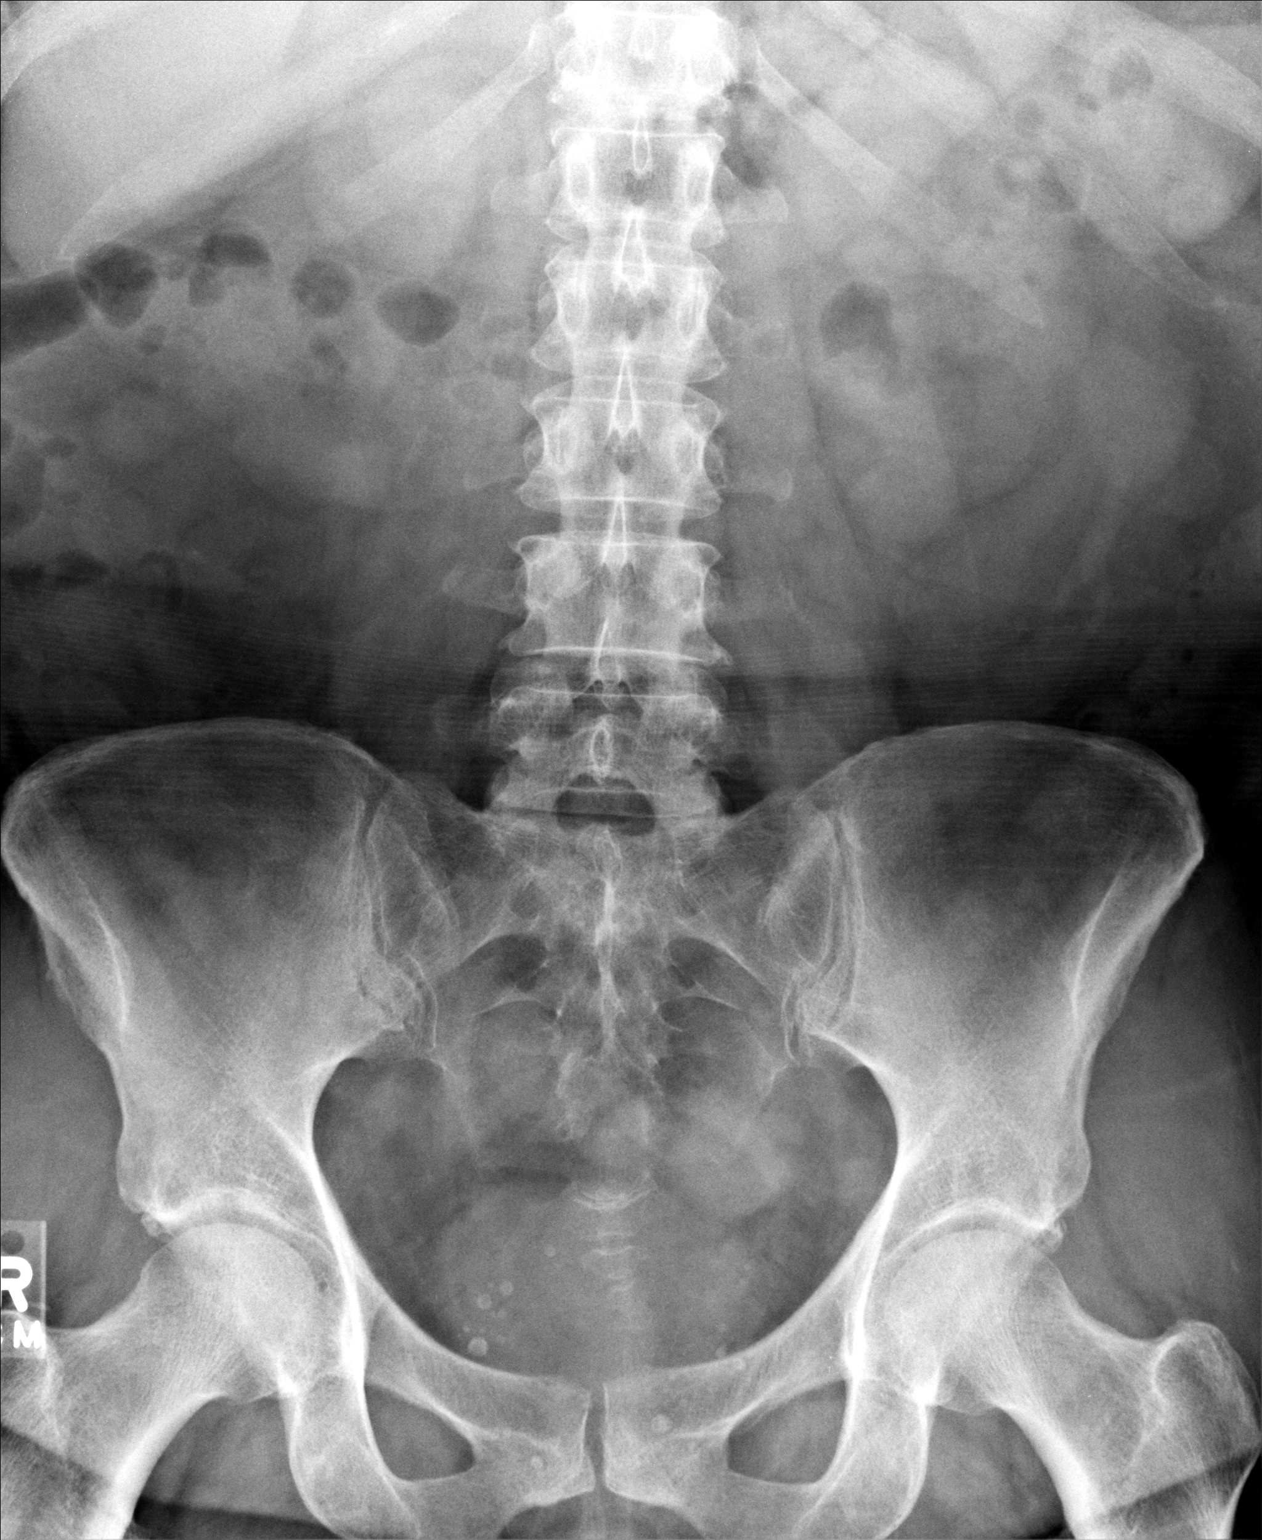

[1 of 1 positions shown; findings below may reference images not displayed]

FINDINGS: There are no urinary tract calculi identified. Abdominal bowel gas
pattern is nonobstructive. There are calcified phleboliths in the lower
pelvis.
Bony structures are intact.
IMPRESSION: No evidence of urinary tract calculi. No acute abnormality in the abdomen.

## 2020-05-06 IMAGING — MR MRI PITUITARY W/WO CONTRAST
5 of 6 series · 35 of 48 positions shown · non-contrast
Comparison: None available as May 11, 2020.

******** ADDENDUM #1 ********/n 
Addendum: 
A comparison MR of the brain dated December 02, 2016 from an outside facility has 
been obtained for comparison. 
Given the substantial technical differences between the 2 examinations there 
does not appear to be a significant difference in the appearance of the 
suspected residual pituitary adenoma. Left sphenoid sinus opacification was also 
present on the prior exam and is unchanged. 
MRI PITUITARY W/WO CONTRAST, 05/06/2020 [DATE]: 
CLINICAL INDICATION:  History of treatment for pituitary adenoma. Patient states 
increasing headaches.
TECHNIQUE: Multiplanar, multiecho position MR images of the pituitary were 
performed with and without intravenous contrast enhancement.  10 cc Gadovist 
injected intravenously at 1.5 cc/s.

[Series 201: FLAIR · axial · 5.0mm · 0.62mm/px · z∈[-77,+78]mm · 9 of 27 slices shown]
[im 1/27]
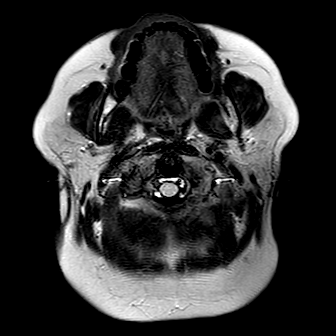
[im 5/27]
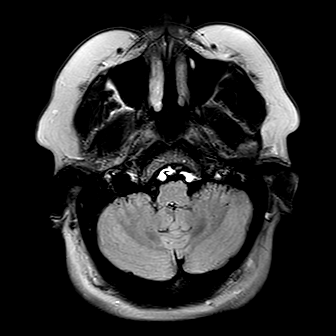
[im 8/27]
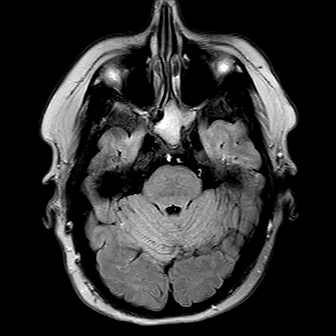
[im 12/27]
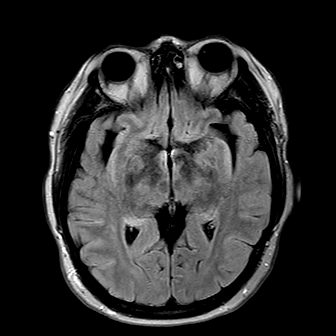
[im 15/27]
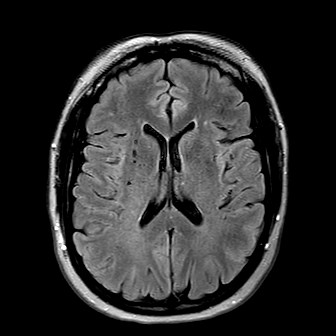
[im 19/27]
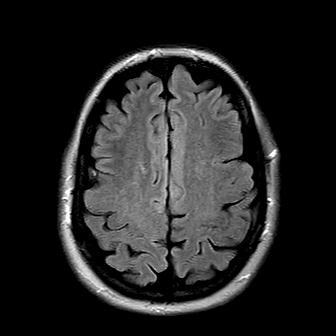
[im 22/27]
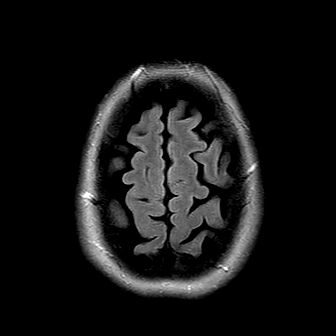
[im 24/27]
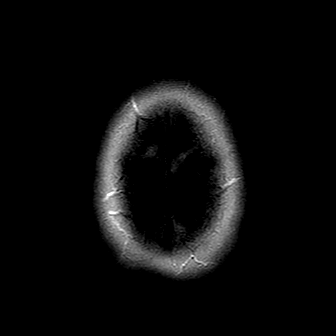
[im 27/27]
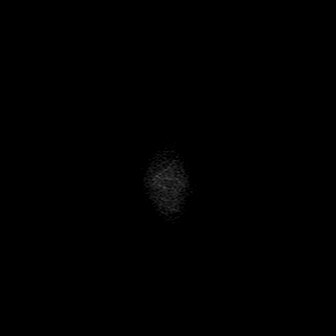

[Series 301: T1 · sagittal · 2.0mm · 0.47mm/px · 6 of 13 slices shown (1 of 2)]
[im 1/13]
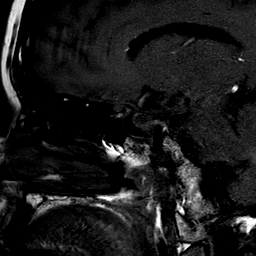
[im 3/13]
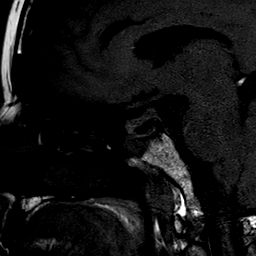
[im 5/13]
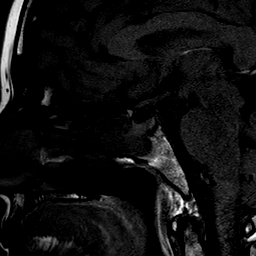
[im 8/13]
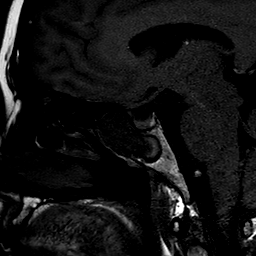
[im 10/13]
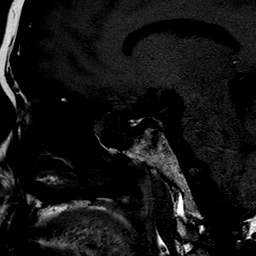
[im 13/13]
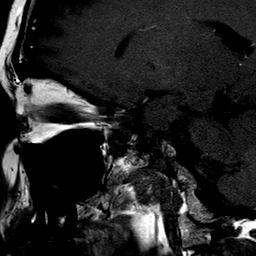

[Series 401: T1 · coronal · 2.5mm · 0.47mm/px · 5 of 15 slices shown (2 of 2)]
[im 1/15]
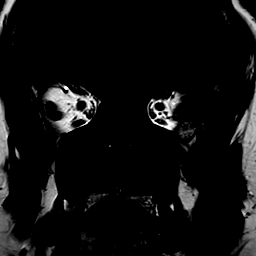
[im 3/15]
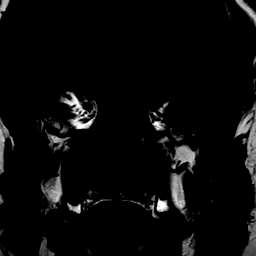
[im 6/15]
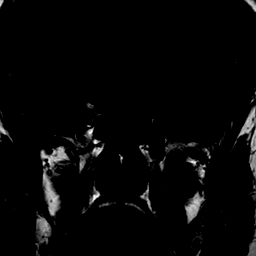
[im 9/15]
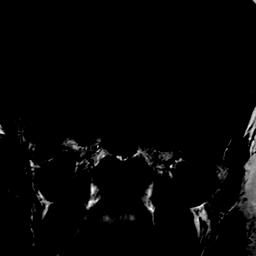
[im 12/15]
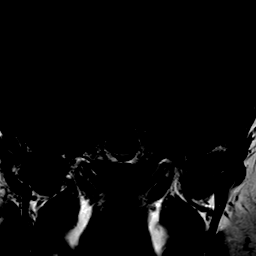

[Series 501: T2 · coronal · 2.5mm · 0.42mm/px · 6 of 15 slices shown]
[im 1/15]
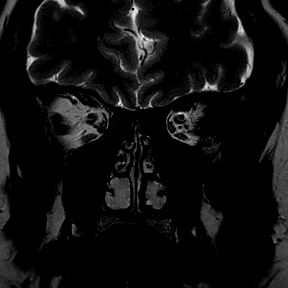
[im 3/15]
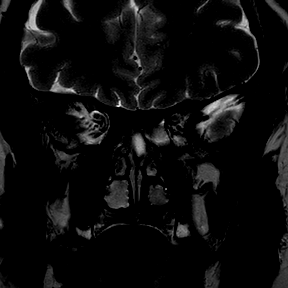
[im 6/15]
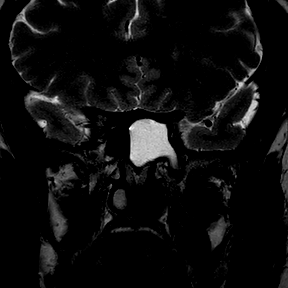
[im 9/15]
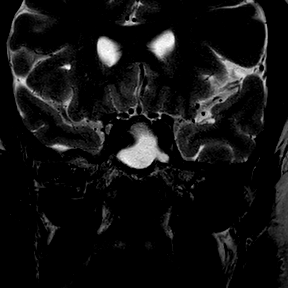
[im 12/15]
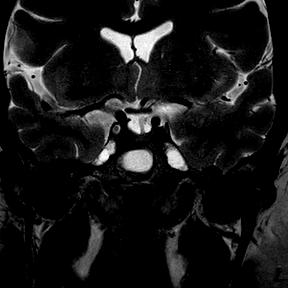
[im 15/15]
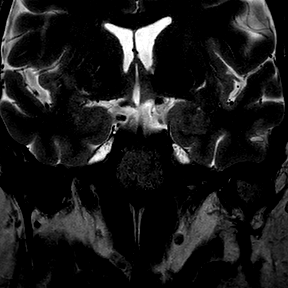

[Series 901: T1 post-contrast · axial · 5.0mm · 0.43mm/px · z∈[-78,+77]mm · 9 of 27 slices shown]
[im 1/27]
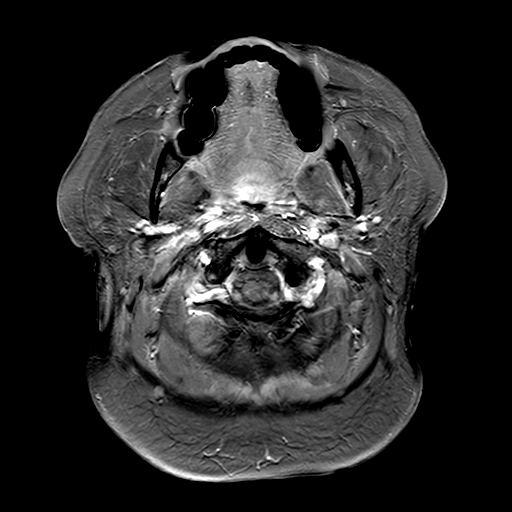
[im 5/27]
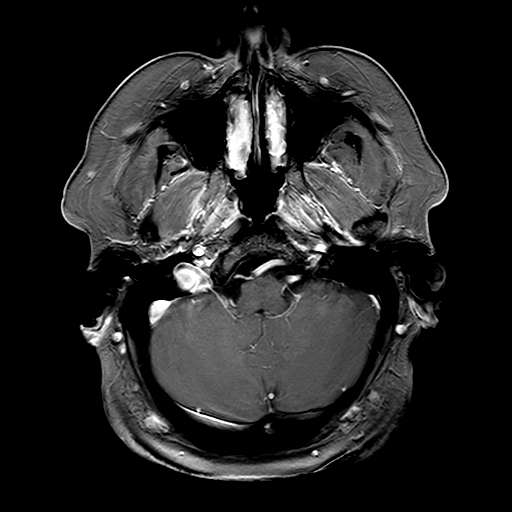
[im 8/27]
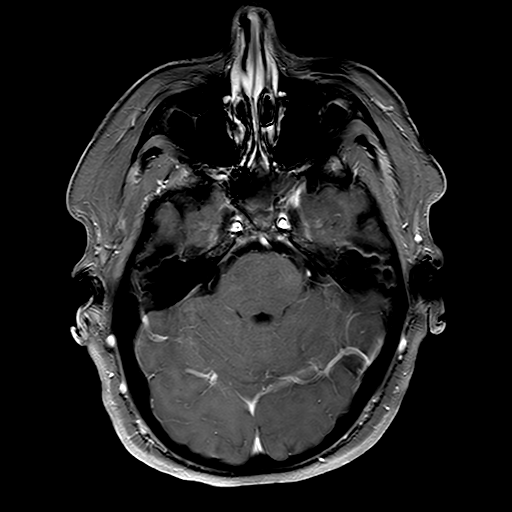
[im 12/27]
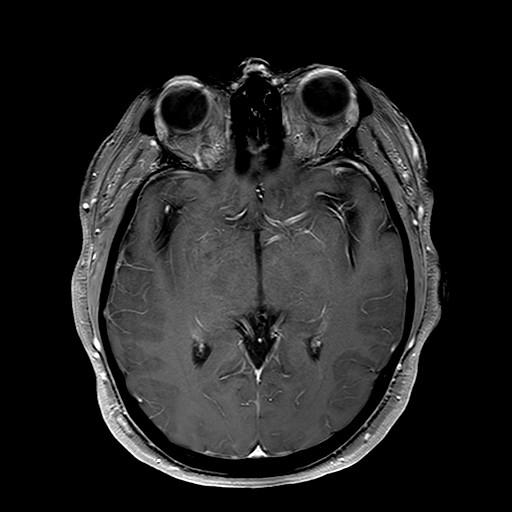
[im 15/27]
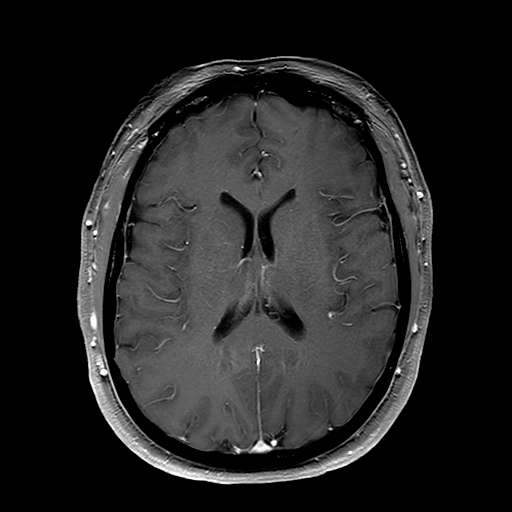
[im 19/27]
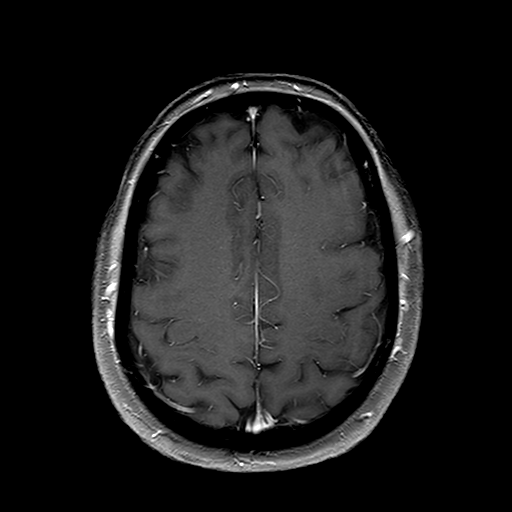
[im 22/27]
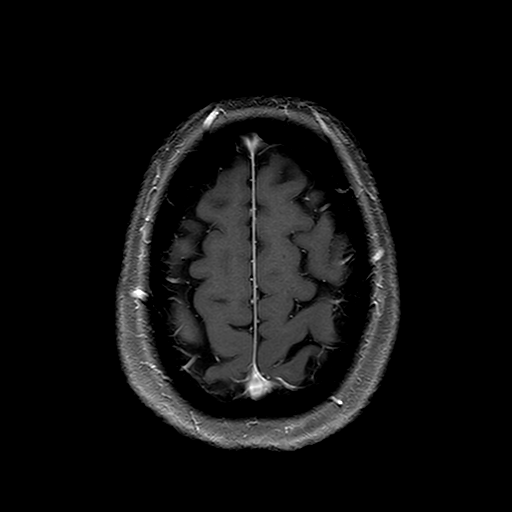
[im 24/27]
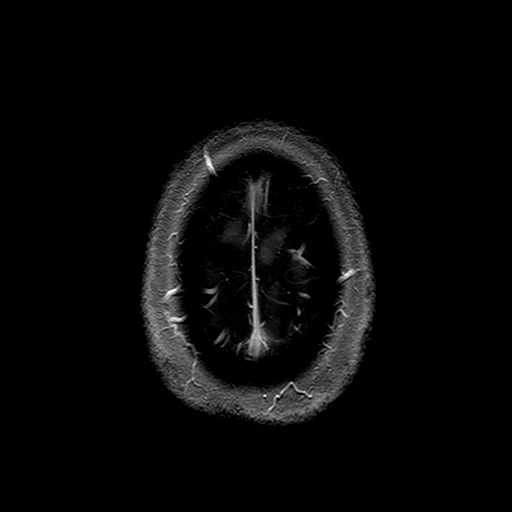
[im 27/27]
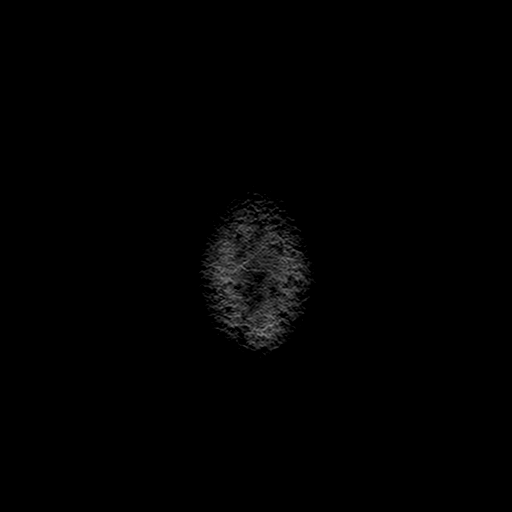

[35 of 48 positions shown; findings below may reference images not displayed]

FINDINGS: The pituitary gland is flattened due to incompetent diaphragm sella and measures 
approximately 3 mm in height. The superior surface the pituitary is flat. There 
is slight leftward deviation of the pituitary stalk. The residual pituitary 
tissue is homogeneous. It looks like that there is probably a small amount of 
diffuse residual adenoma infiltrating throughout the RIGHT cavernous sinus along 
the anterior medial aspect and posterolateral aspect, not discrete enough to 
provide useful measurements. No clinically significant mass effect is produced. 
If prior exams can be obtained, comparison can be performed to determine if this 
is stable or not. There is dehiscence of the RIGHT side of the floor of the 
sella. There is diffuse opacification of the LEFT sphenoid sinus with diffuse 
mucosal thickening posteriorly. Left cavernous sinus is unremarkable. No 
evidence for suprasellar pathology. 
Screening images through the brain show normal morphology and volume of the 
brain. No abnormal brain enhancement is found. There is no intracranial mass 
effect or extra-axial fluid collection. There is potentially a developmental 
venous anomaly along the medial RIGHT frontal lobe.
IMPRESSION: 1. Suspect small amounts of residual pituitary adenoma infiltrating throughout 
the RIGHT cavernous sinus without significant mass effect. If comparison images 
can be obtained an addendum will be added to this report stating whether this is 
stable or growing. 
2. Complete opacification of the LEFT sphenoid sinus.

## 2020-05-06 IMAGING — CT CT ABDOMEN AND PELVIS WITH CONTRAST
2 series · 12 of 36 positions shown, 14 images · IV contrast (Iodine)
Comparison: There are no prior exam(s) available for comparison within the past 
12 months; however, comparison was made to the prior exam(s) 5-18

******** ADDENDUM #1 ********/n 
There is a cystic structure anterior to the uterus and anterior and superior to 
the urinary bladder measuring up to 9.7 cm x 7.9 cm. These findings were 
discussed with Dr. Tiger on 07/01/2020 [DATE]. 
CT ABDOMEN AND PELVIS WITH CONTRAST, 05/06/2020 [DATE]: 
A search for DICOM formatted images was conducted for prior CT imaging studies 
completed at a non-affiliated media free facility. 
CLINICAL INDICATION: Cystic kidney. Splenomegaly. Abdominal pain for one half 
years.
TECHNIQUE: The abdomen and pelvis was scanned from lung bases through the pubic 
rami with 100 mL of Isovue 300 on a high-resolution CT scanner using dose 
reduction techniques.  Routine MPR reconstructions were performed.

[Series 401: axial w, idose (3) · axial · 1.11mm/px · z∈[+619,+1039]mm · 11 of 155 slices shown, 13 images]
[im 10/155  soft-tissue]
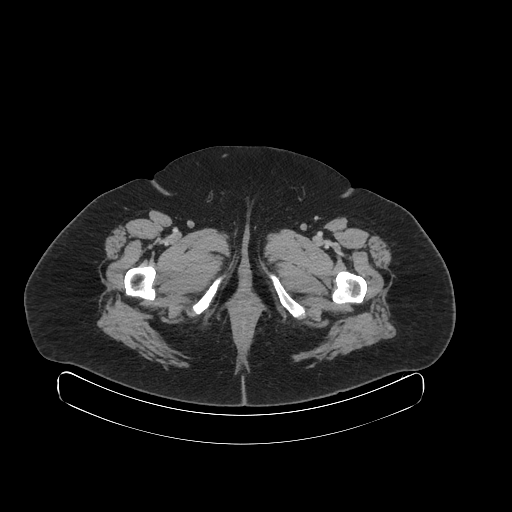
[im 10/155  bone]
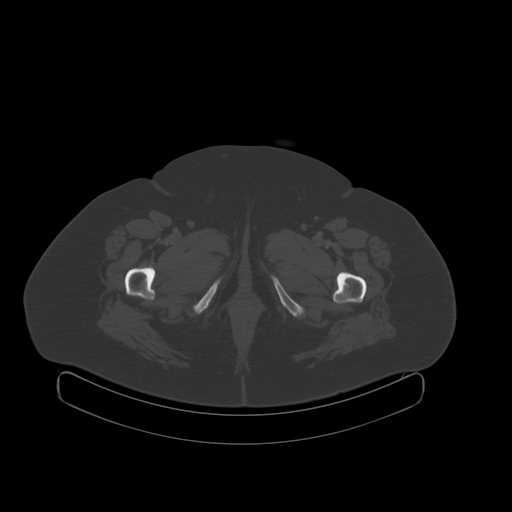
[im 30/155  soft-tissue]
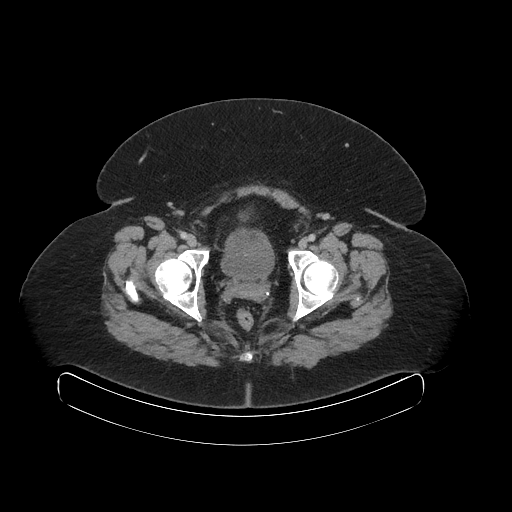
[im 50/155  soft-tissue]
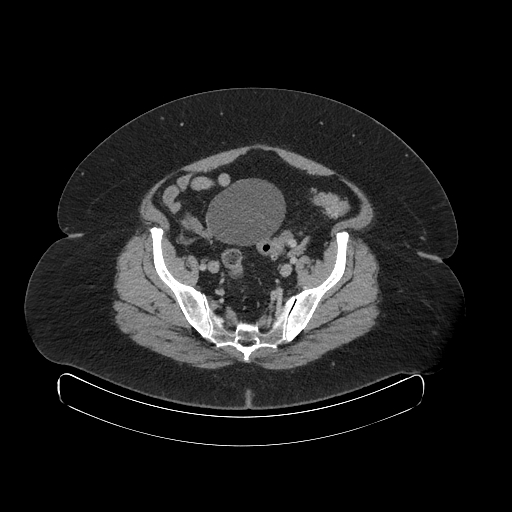
[im 70/155  soft-tissue]
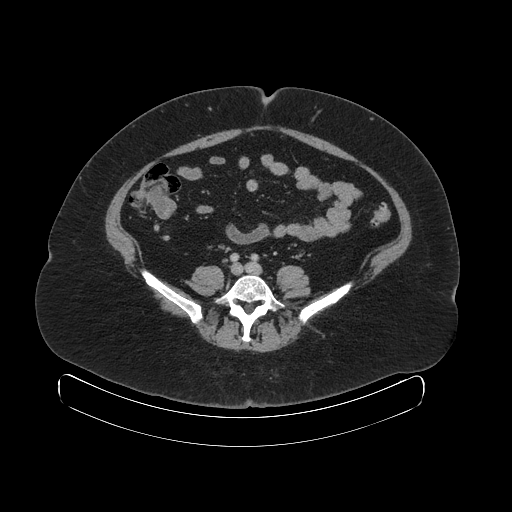
[im 85/155  soft-tissue]
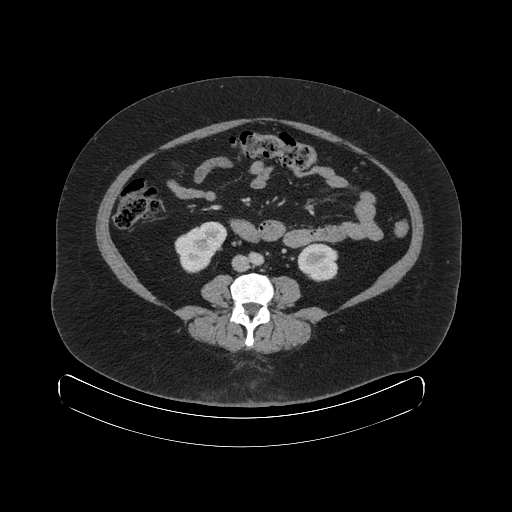
[im 105/155  soft-tissue]
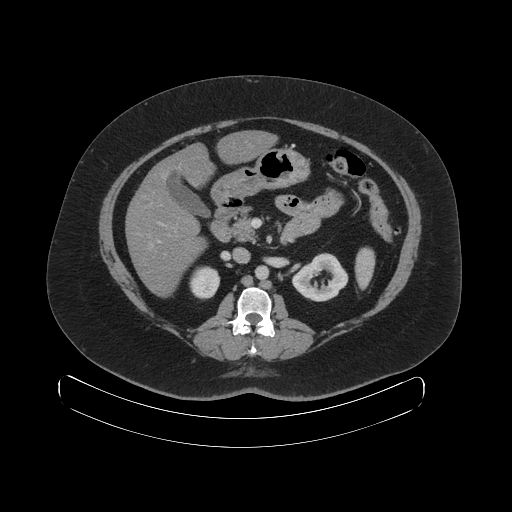
[im 125/155  soft-tissue]
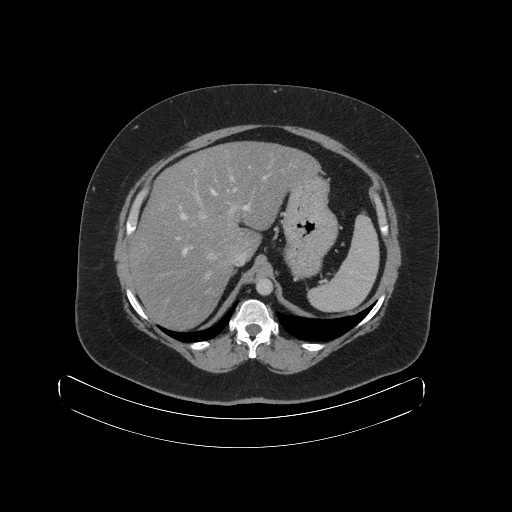
[im 135/155  lung]
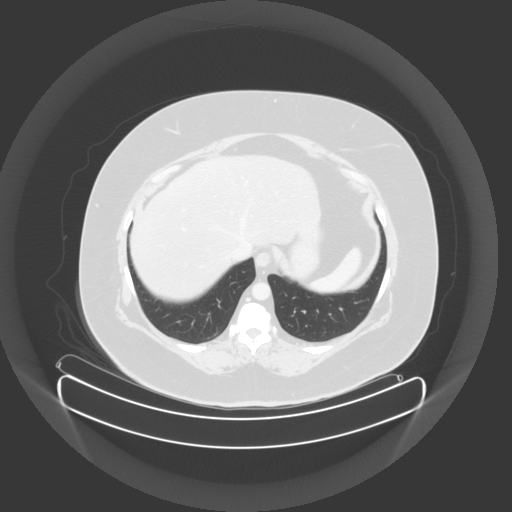
[im 140/155  lung]
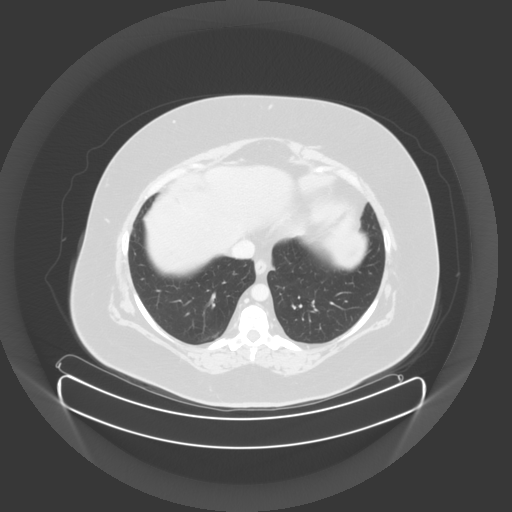
[im 145/155  soft-tissue]
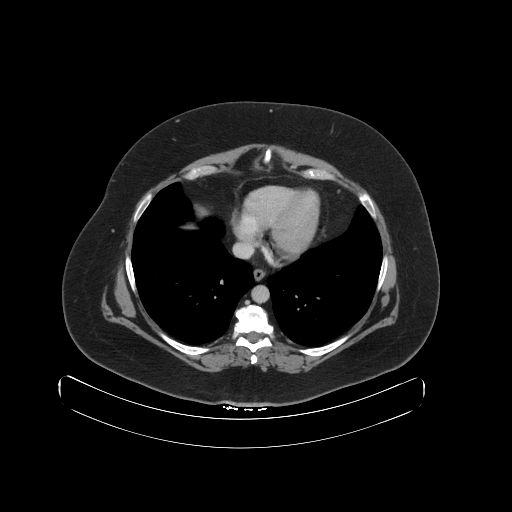
[im 145/155  lung]
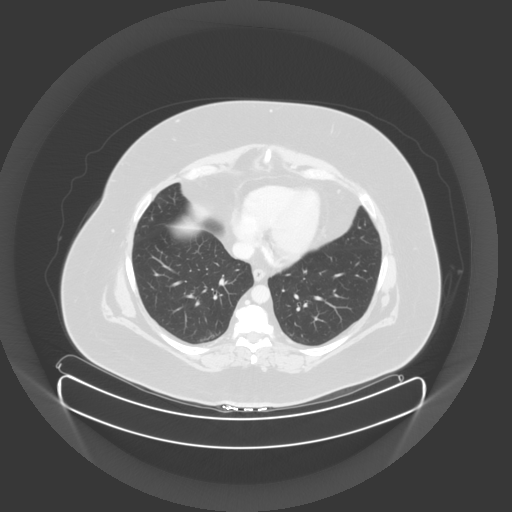
[im 150/155  lung]
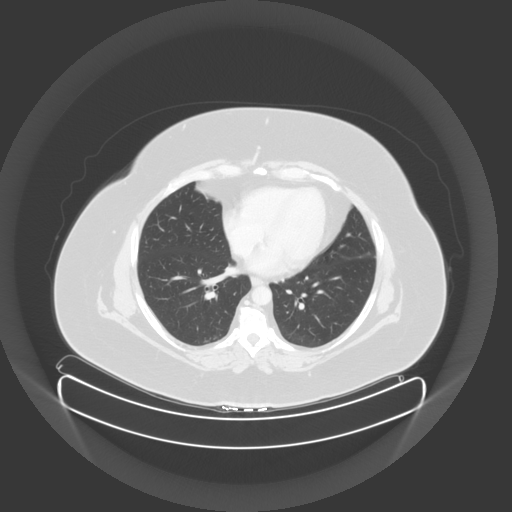

[Series 403: sag w, idose (3) · sagittal · 0.45mm/px · 1 of 182 slices shown]
[im 61/182  soft-tissue]
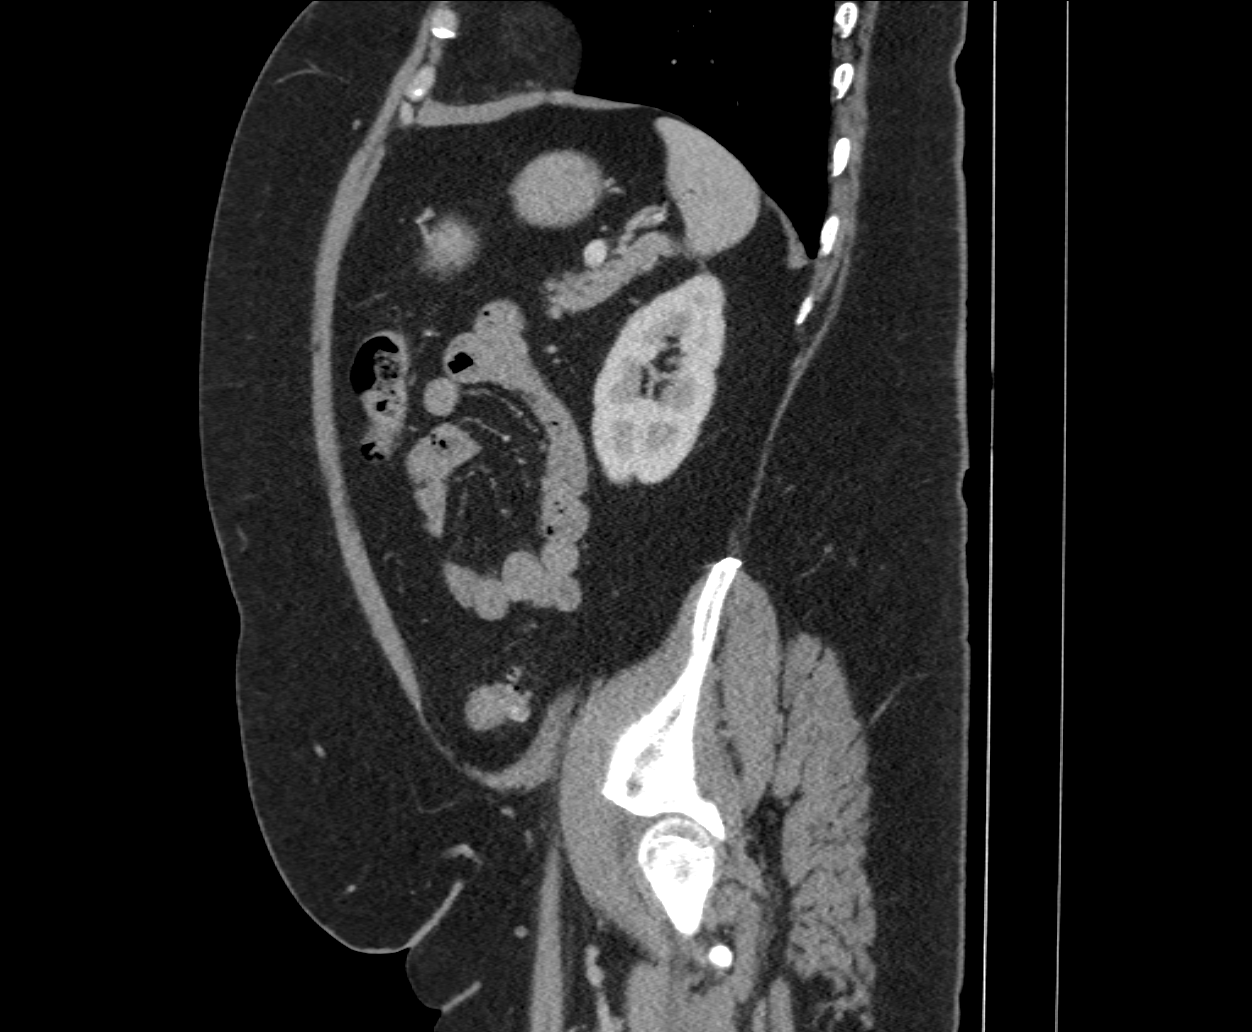

[12 of 36 positions shown; findings below may reference images not displayed]

FINDINGS: LUNG BASES: Lung bases are clear. No pleural effusions. 
HEPATOBILIARY: No mass or biliary dilatation. No gallstones. Fatty infiltration 
of the liver is noted. 
SPLEEN: Normal in size. 
PANCREAS: No ductal dilatation or mass. 
ADRENALS: No mass. 
GENITOURINARY: No enhancing mass or hydronephrosis.  There is only one residual 
cyst in the left kidney measuring 11 mm x 8 mm and unchanged compared to 
previous study seen in the posterior midpole of the left kidney. Bladder is 
unremarkable. 
LYMPH NODES: No adenopathy. 
STOMACH, SMALL BOWEL AND COLON: No bowel wall thickening or obstruction. Sigmoid 
diverticulosis without active diverticulitis. 
VASCULAR STRUCTURES: No aneurysm. 
MUSCULOSKELETAL: No acute osseous abnormality. Scattered degenerative changes. 
ADDITIONAL FINDINGS: Uterus seen in the pelvis with hypodensity centrally 
measuring up to 1.1 cm which may be the endometrium in this perimenopausal 
female.
IMPRESSION: There are previously 2 left renal cysts with only one stable left renal cyst, 
this may again be a hemorrhagic cyst but does not demonstrate significant 
enhancement. 
Mild suspected fatty infiltration of the liver. 
Diverticulosis of the sigmoid colon. 
Uterus seen in the pelvis with hypodensity centrally measuring up to 1.1 cm 
which may be the endometrium in this perimenopausal female. This can be further 
pelvic ultrasound if warranted. 
RADIATION DOSE REDUCTION: All CT scans are performed using radiation dose 
reduction techniques, when applicable.  Technical factors are evaluated and 
adjusted to ensure appropriate moderation of exposure.  Automated dose 
management technology is applied to adjust the radiation doses to minimize 
exposure while achieving diagnostic quality images.

## 2020-06-02 IMAGING — MG MAMMOGRAPHY SCREENING BILATERAL 3D TOMOSYNTHESIS WITH CAD
6 series · 6 of 18 positions shown · non-contrast
Comparison: None

MAMMOGRAPHY SCREENING BILATERAL 3D TOMOSYNTHESIS WITH CAD, 06/02/2020 [DATE]: 
CLINICAL INDICATION: Screening
TECHNIQUE: Digital bilateral mammograms and 3-D Tomosynthesis were obtained. 
These were interpreted both primarily and with the aid of computer-aided 
detection system. 
BREAST DENSITY: (Level B) There are scattered areas of fibroglandular density.

[L MLO]
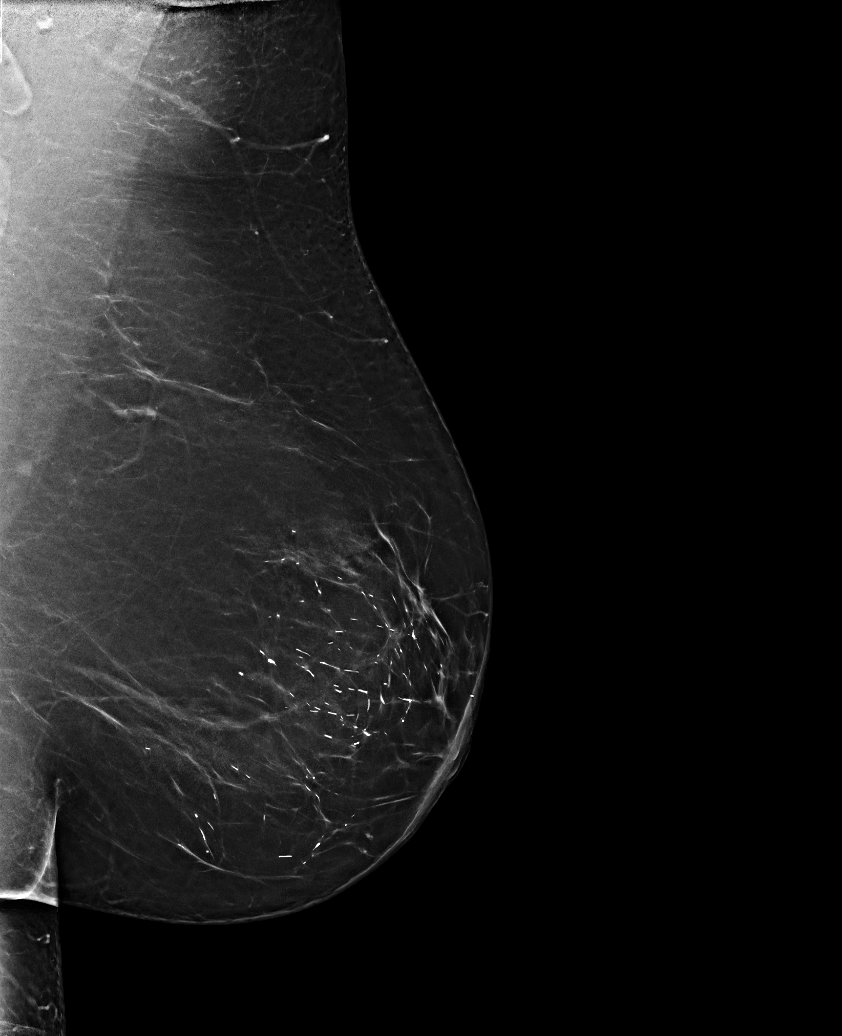

[R MLO]
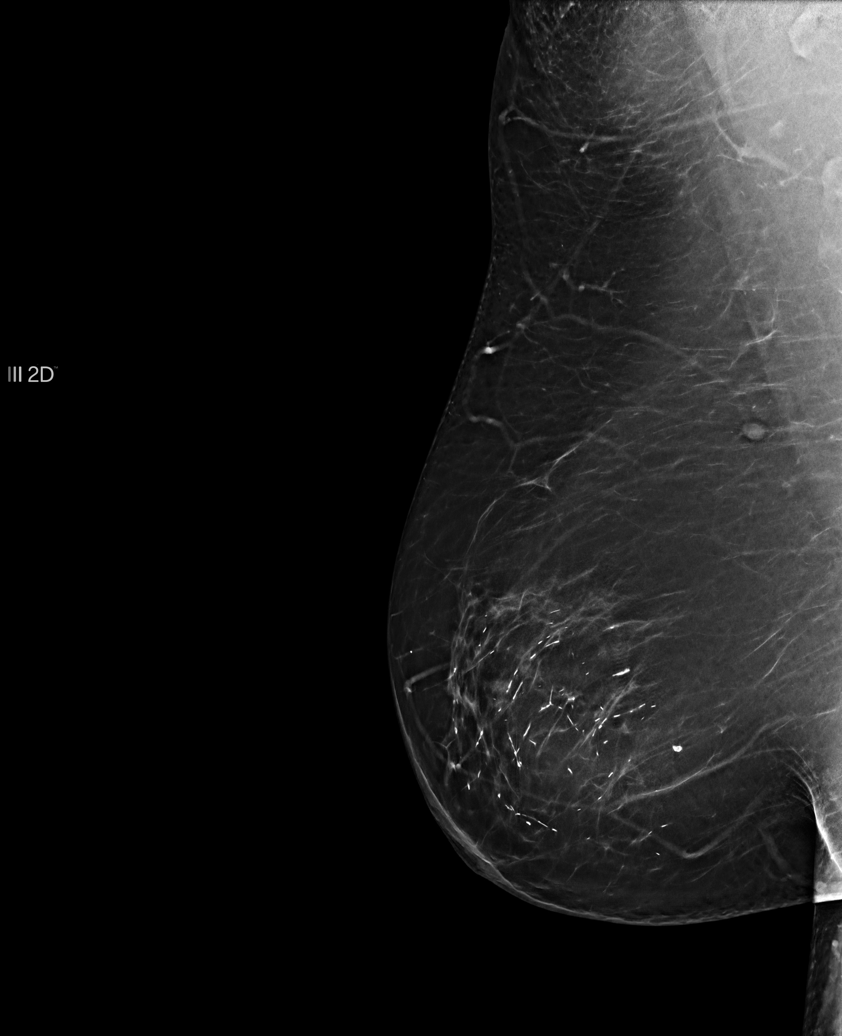

[L CC]
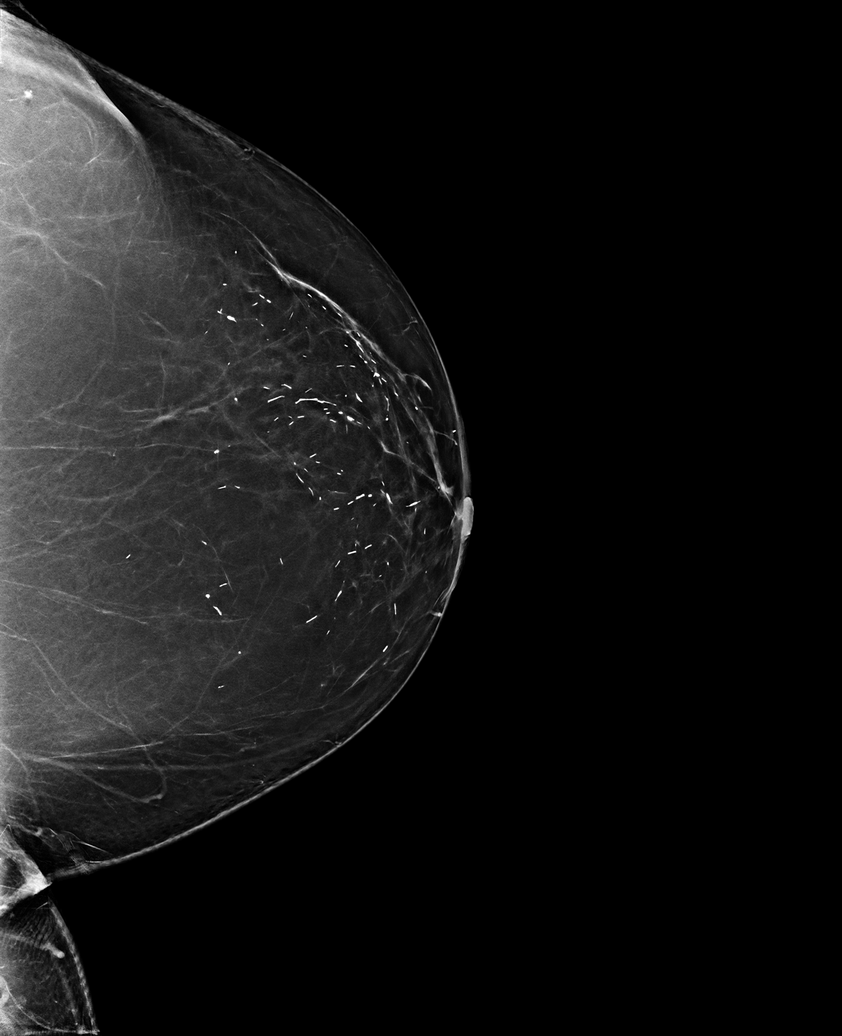

[L MLO tomo · tomo slice 50/99.0]
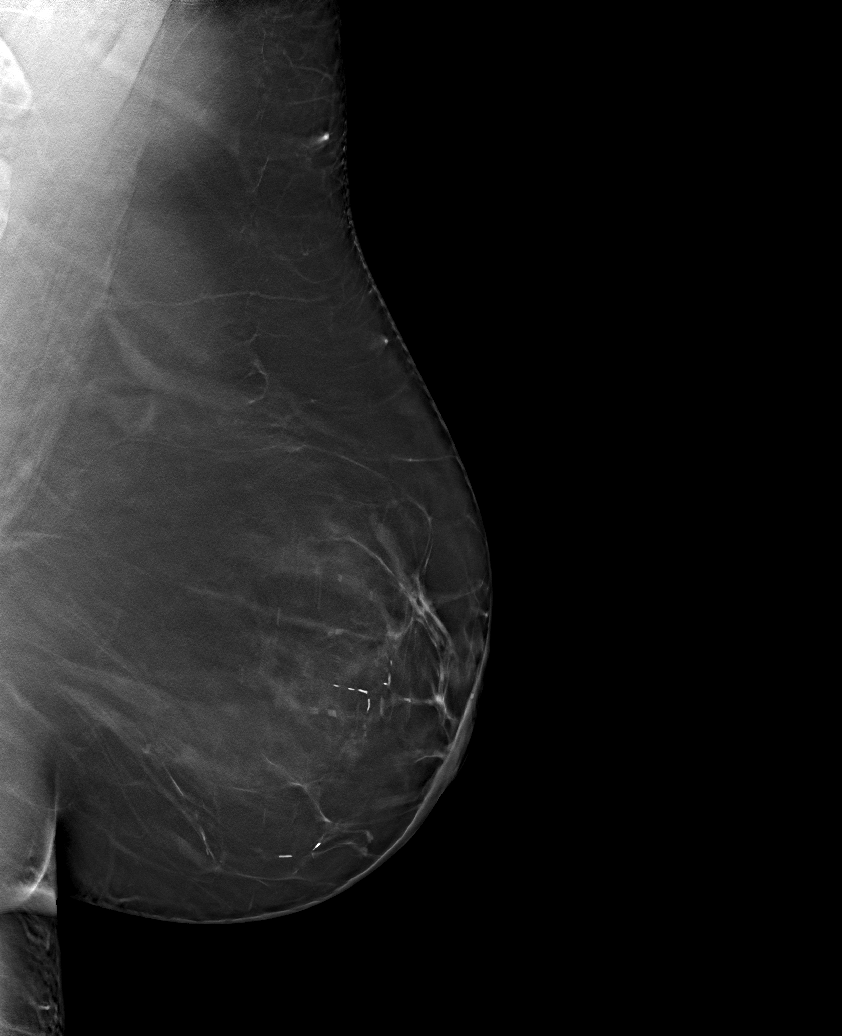

[R CC tomo · tomo slice 41/80.0]
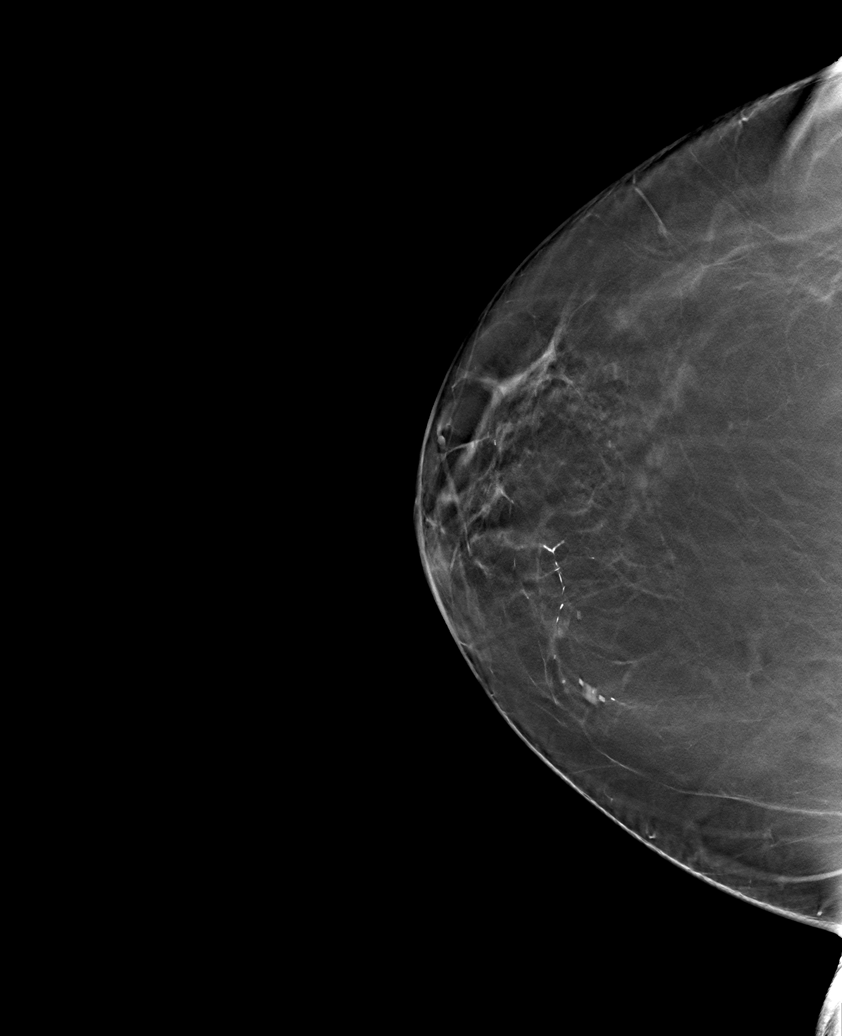

[L CC tomo · tomo slice 47/94.0]
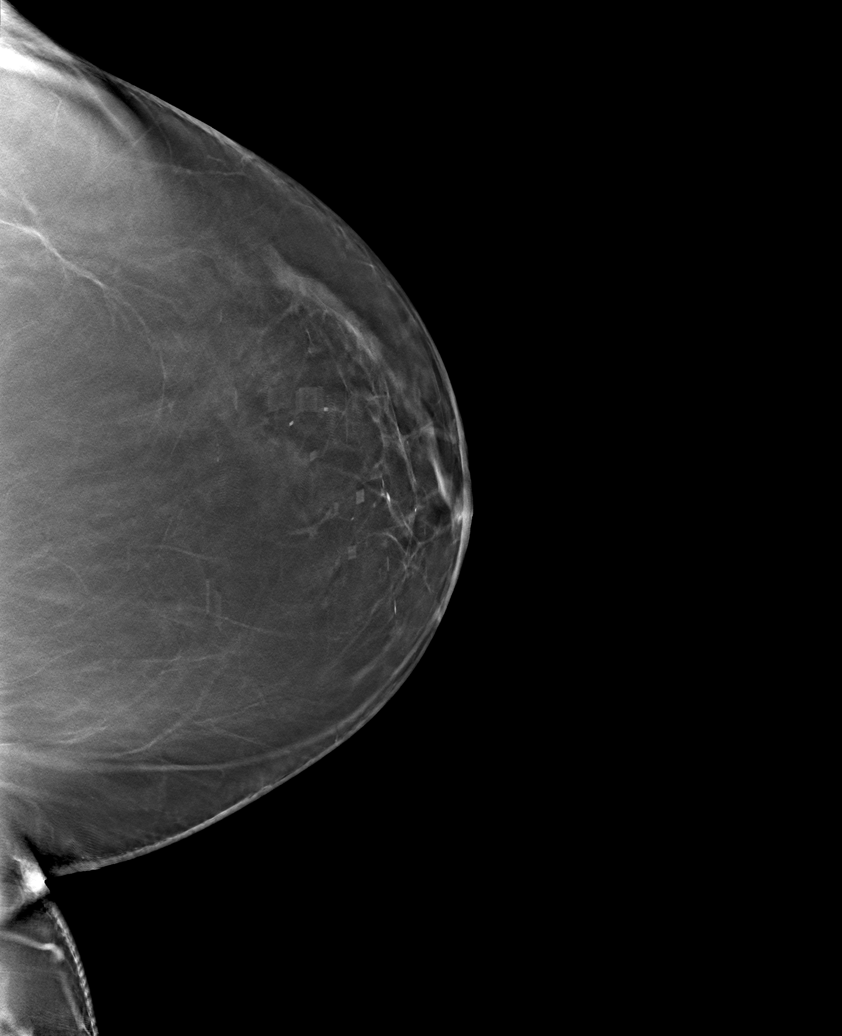

[6 of 18 positions shown; findings below may reference images not displayed]

FINDINGS: Extensive secretory calcifications. No suspicious mass, calcifications, 
or area of architectural distortion in either breast.
IMPRESSION: No mammographic evidence of malignancy in either breast. 
( BI-RADS 2) Benign findings. Routine mammographic follow-up is recommended.

## 2021-01-06 IMAGING — CT CT CALCIUM SCORING
1 series · 15 of 20 positions shown, 19 images · non-contrast
Comparison: There are no previous exams available for comparison.

________________________________________________________________________________________________ 
CT CALCIUM SCORING, 01/06/2021 [DATE]:
INDICATION: Evaluate coronary artery calcification.

[Series 2: cascoreseq 3.0 b35s 60% · axial · 0.39mm/px · z∈[+914,+1035]mm · 15 of 91 slices shown, 19 images]
[im 5/91  vessel]
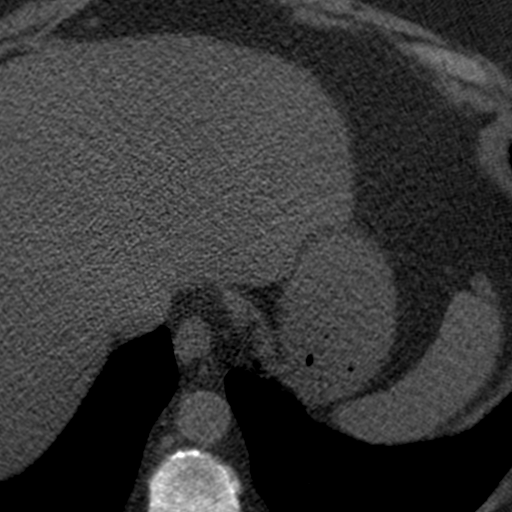
[im 5/91  lung]
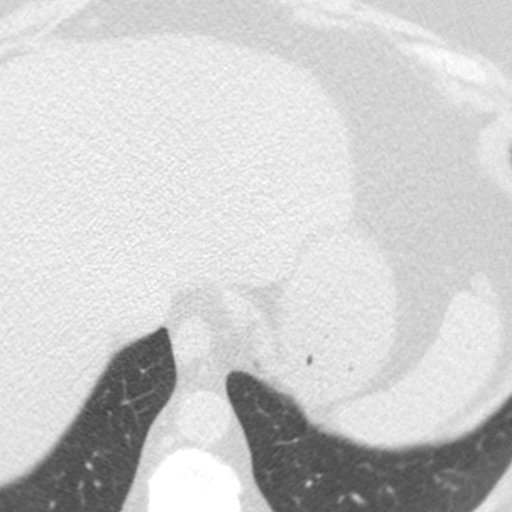
[im 10/91  vessel]
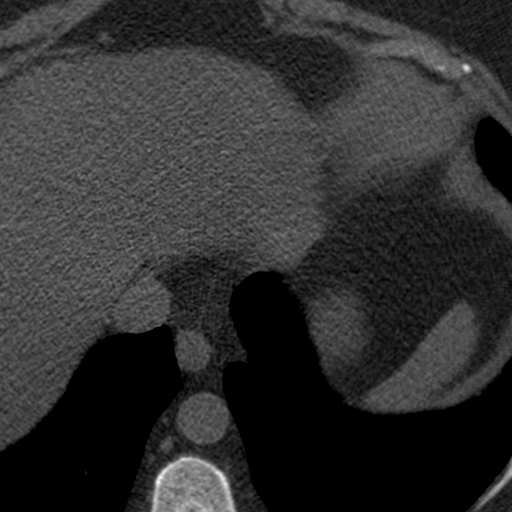
[im 19/91  vessel]
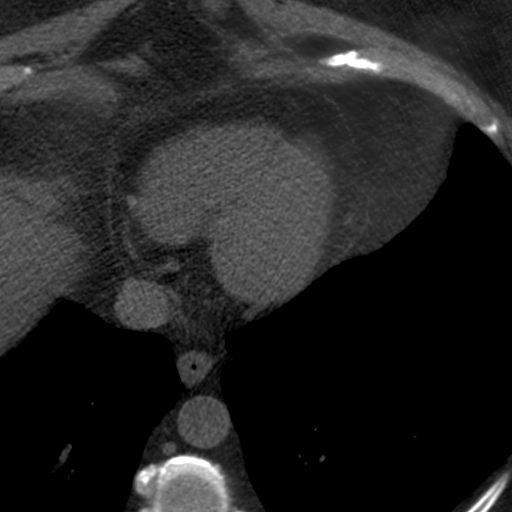
[im 24/91  vessel]
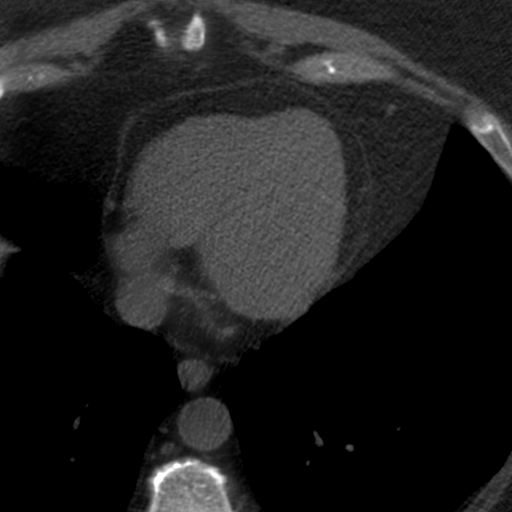
[im 29/91  vessel]
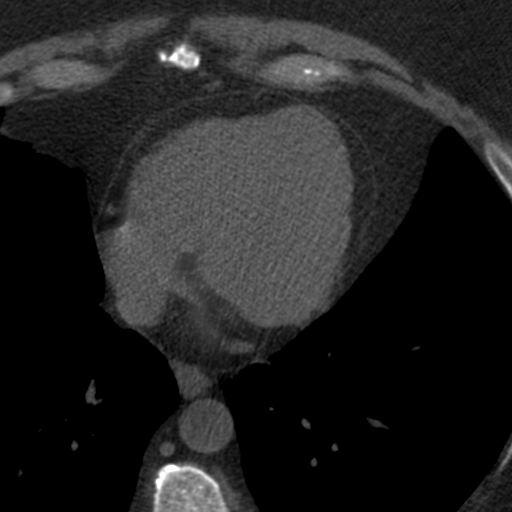
[im 29/91  lung]
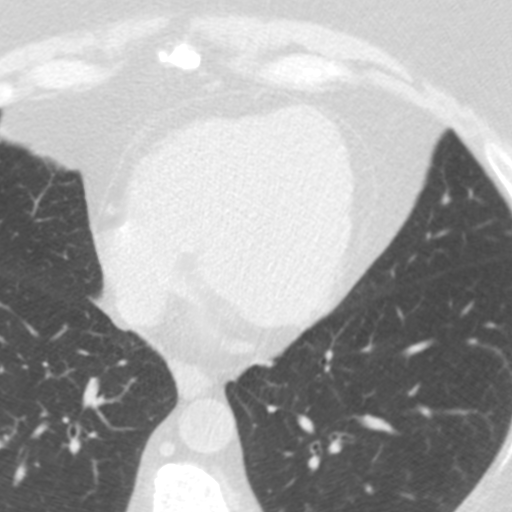
[im 34/91  vessel]
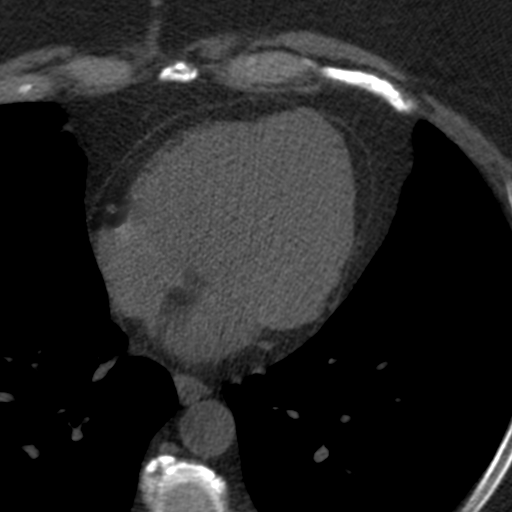
[im 38/91  vessel]
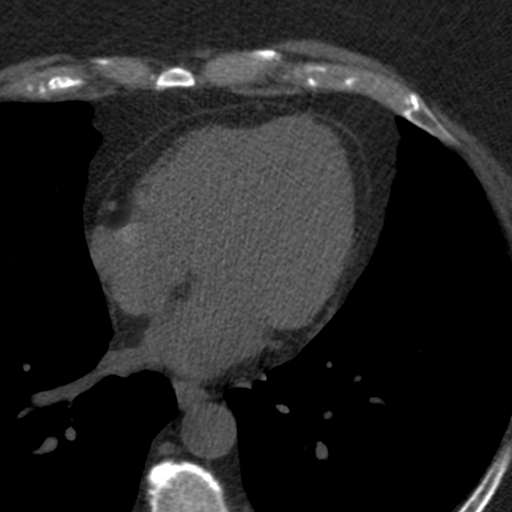
[im 48/91  vessel]
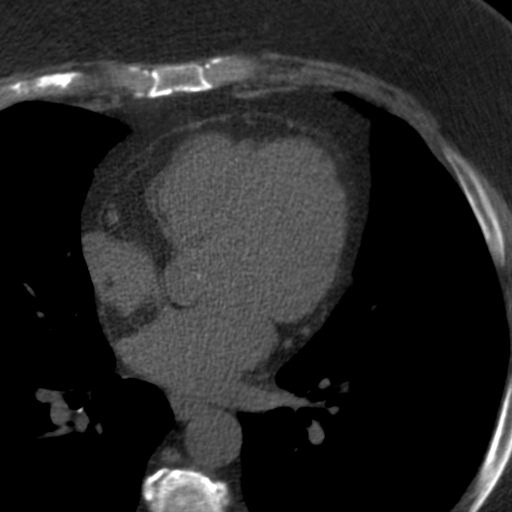
[im 53/91  vessel]
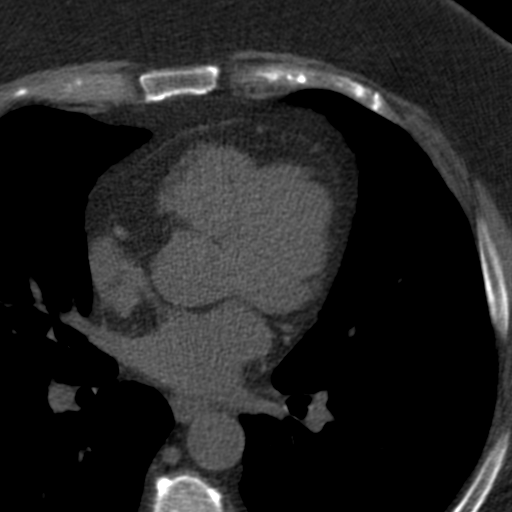
[im 53/91  lung]
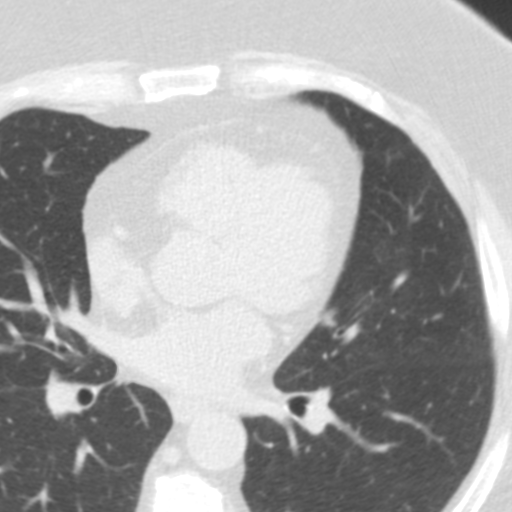
[im 57/91  vessel]
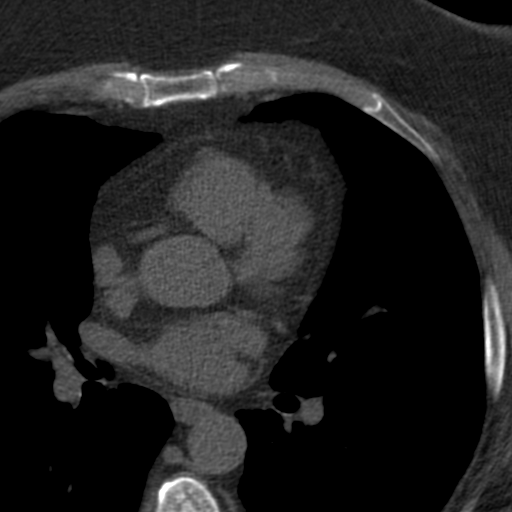
[im 62/91  vessel]
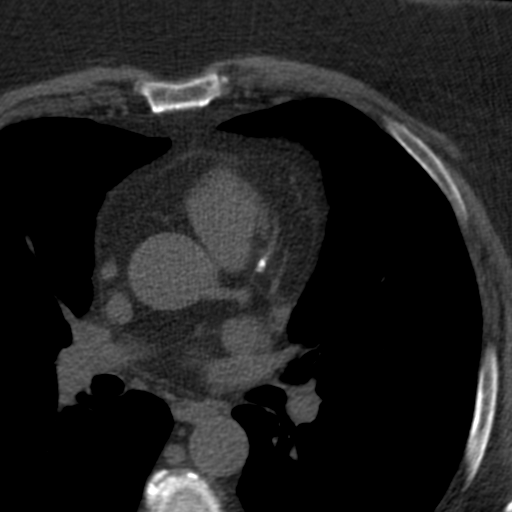
[im 67/91  vessel]
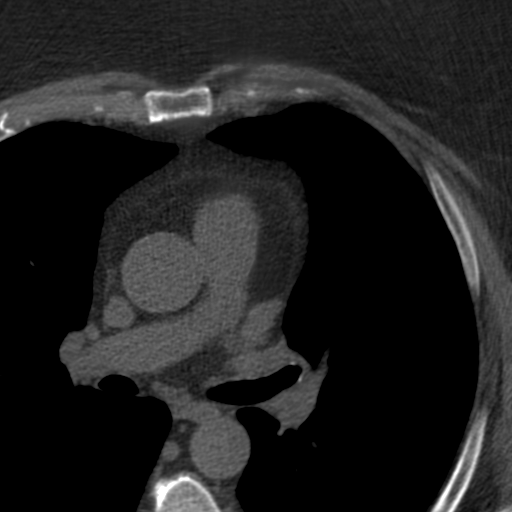
[im 76/91  vessel]
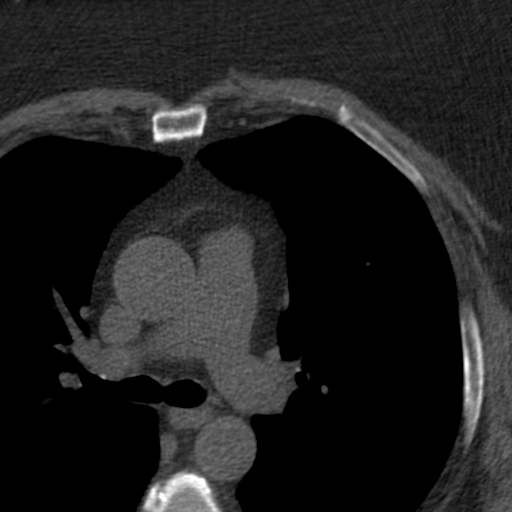
[im 76/91  lung]
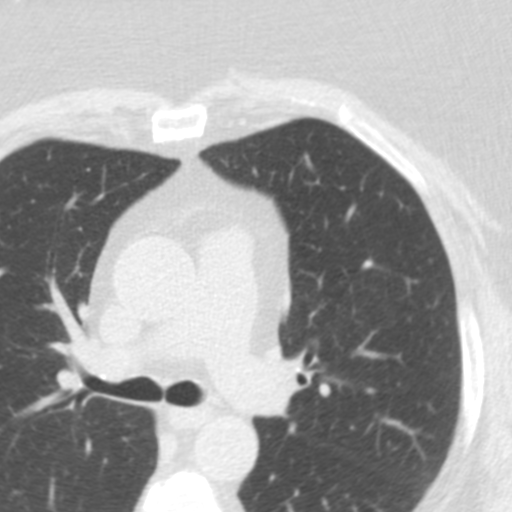
[im 81/91  vessel]
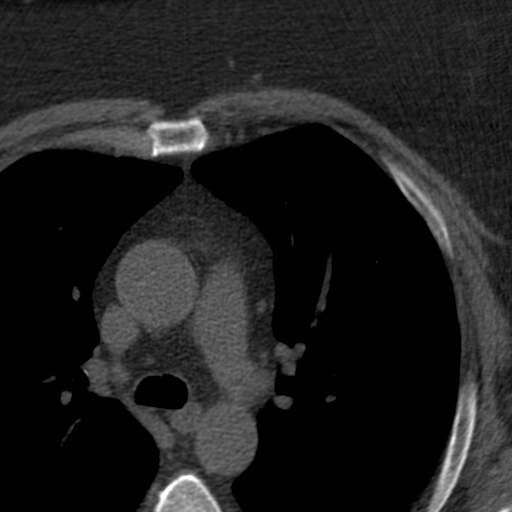
[im 86/91  vessel]
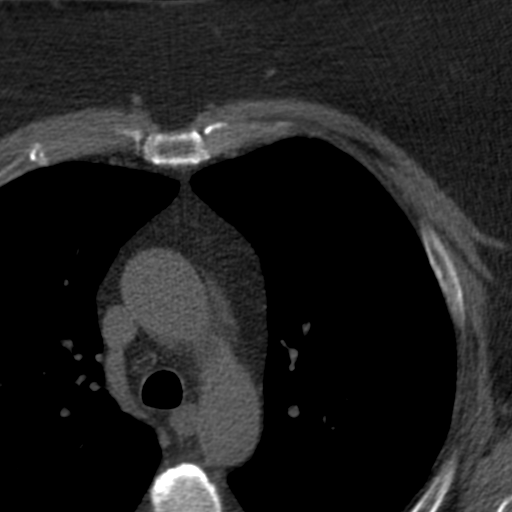

[15 of 20 positions shown; findings below may reference images not displayed]

FINDINGS: Visualized lungs are clear.
IMPRESSION: Total Calcium Score: 88 
No ancillary findings. 
Calcium score                                     Presence of Plaque 
0                                                    No evidence of plaque 
1-10                                               Minimal evidence of plaque 
11-100                                            Mild evidence of plaque 
101-400                                          Moderate evidence of plaque 
Over 400                                        Extensive evidence of plaque     

Calcium scoring sheets to follow. 
RADIATION DOSE REDUCTION: All CT scans are performed using radiation dose 
reduction techniques, when applicable.  Technical factors are evaluated and 
adjusted to ensure appropriate moderation of exposure.  Automated dose 
management technology is applied to adjust the radiation doses to minimize 
exposure while achieving diagnostic quality images.

## 2021-01-09 IMAGING — MR MRI CERVICAL SPINE WITHOUT CONTRAST
6 series · 33 of 48 positions shown · IV contrast (gadolinium)
Comparison: Cervical radiograph October 15, 2019, cranial CT August 2012

________________________________________________________________________________________________ 
MRI CERVICAL SPINE WITHOUT CONTRAST, 01/09/2021 [DATE]: 
CLINICAL INDICATION: Neck pain
TECHNIQUE: Sagittal T1, Sagittal T2, Sagittal STIR, Axial TSE and Axial EDIIF 
images of the cervical spine were performed without intravenous gadolinium 
enhancement.

[Series 801: survey · axial · 10.0mm · 0.94mm/px · z∈[-15,+150]mm · 5 of 9 slices shown]
[im 1/9]
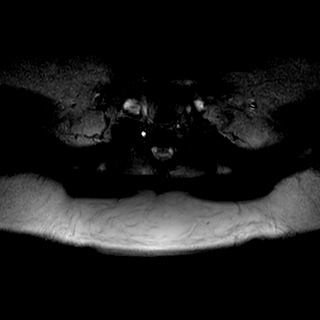
[im 3/9]
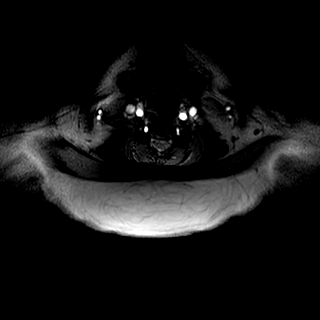
[im 5/9]
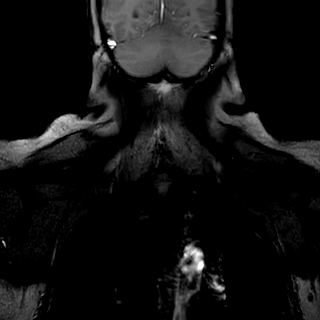
[im 7/9]
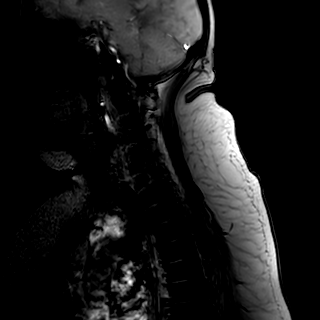
[im 9/9]
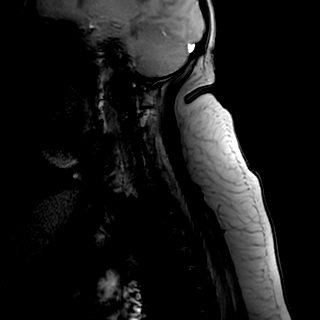

[Series 901: t2w_tse cor · coronal · 5.0mm · 0.52mm/px · 3 of 7 slices shown]
[im 1/7]
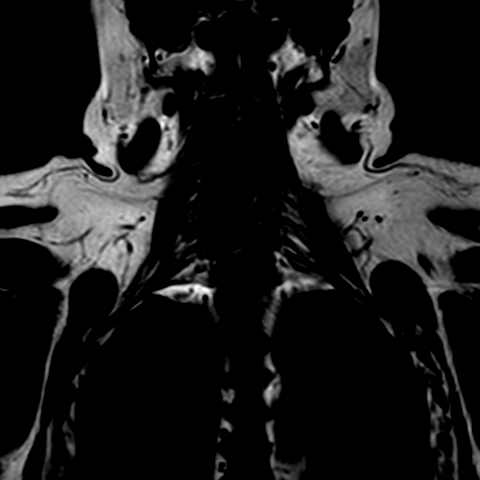
[im 4/7]
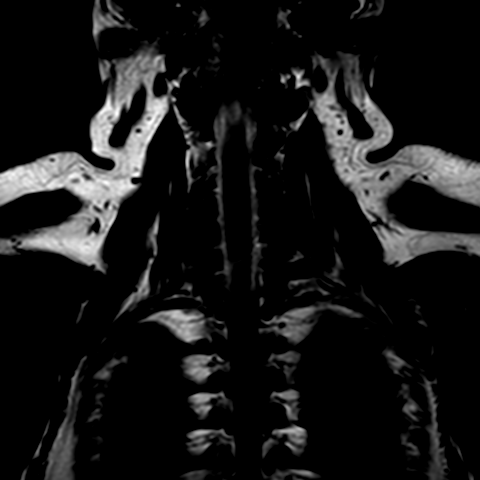
[im 7/7]
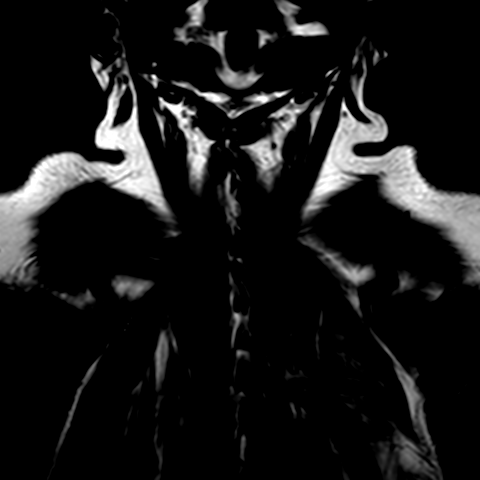

[Series 1001: t2w_tse sag · sagittal · 3.0mm · 0.47mm/px · 7 of 15 slices shown]
[im 1/15]
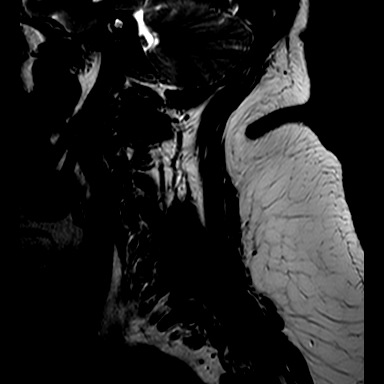
[im 3/15]
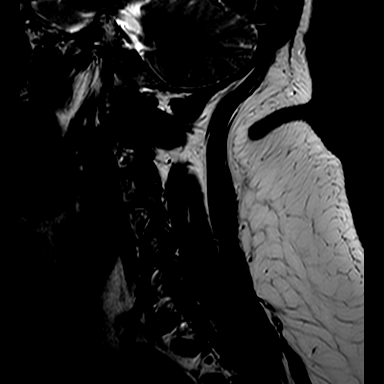
[im 5/15]
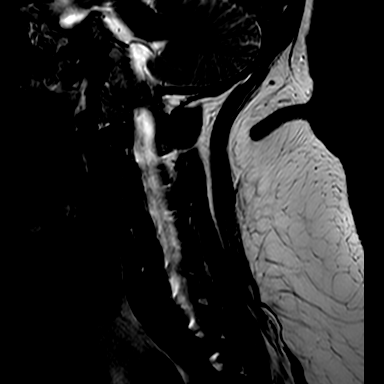
[im 8/15]
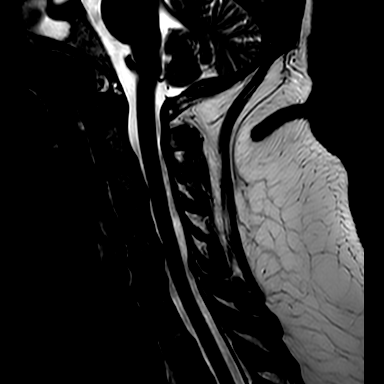
[im 10/15]
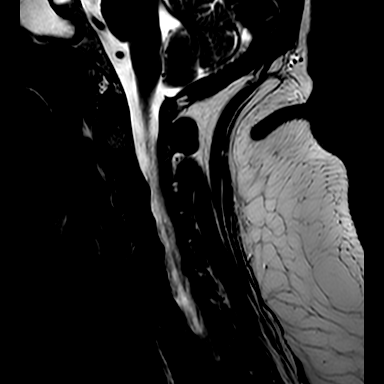
[im 12/15]
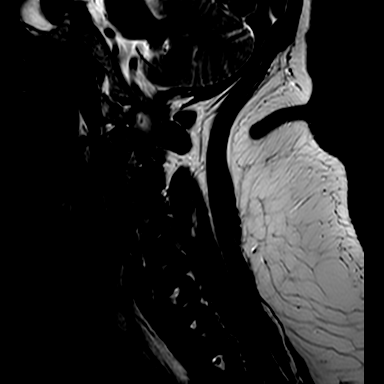
[im 15/15]
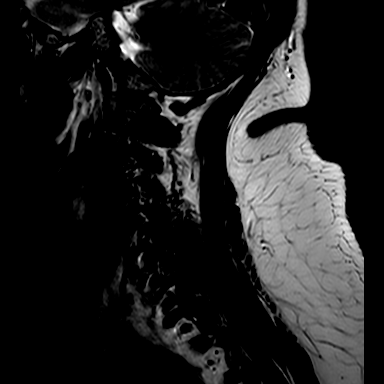

[Series 1101: t1w_tse sag · sagittal · 3.0mm · 0.47mm/px · 7 of 15 slices shown]
[im 1/15]
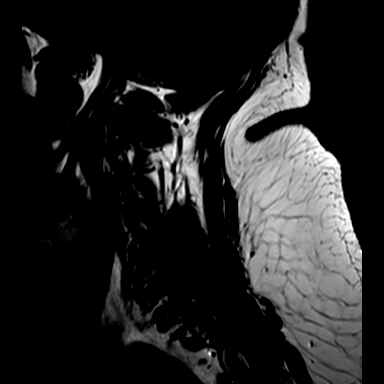
[im 3/15]
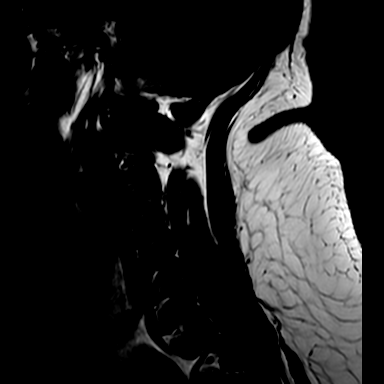
[im 5/15]
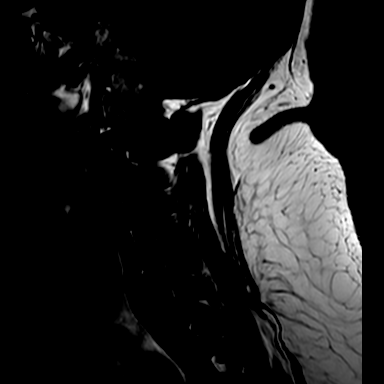
[im 8/15]
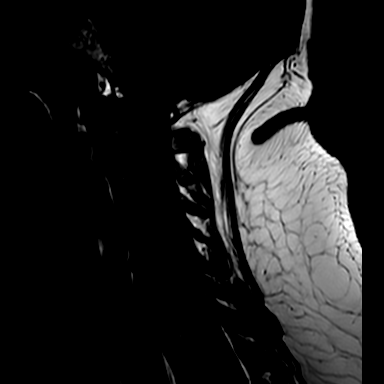
[im 10/15]
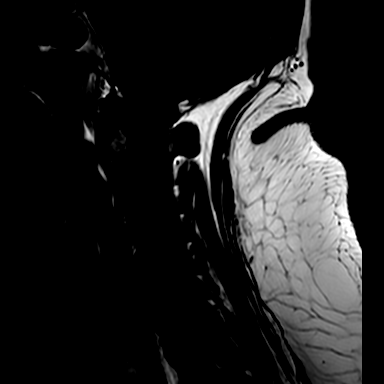
[im 12/15]
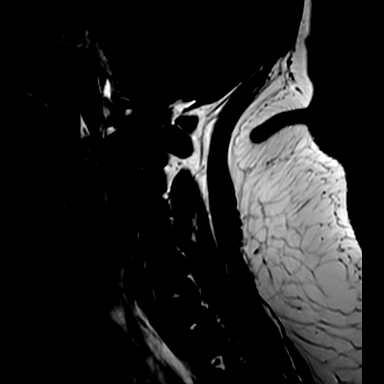
[im 15/15]
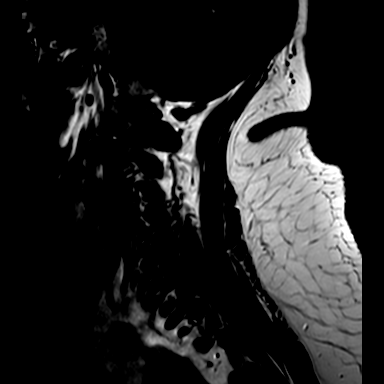

[Series 1201: stir_longte sag · sagittal · 3.0mm · 0.51mm/px · 7 of 15 slices shown]
[im 1/15]
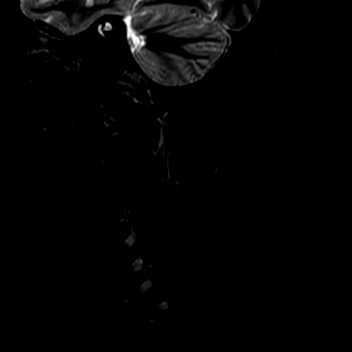
[im 3/15]
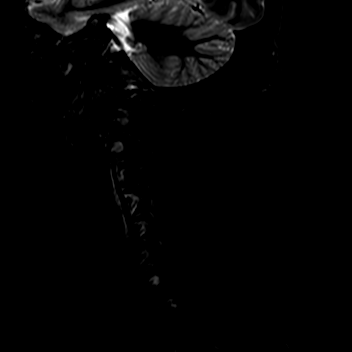
[im 5/15]
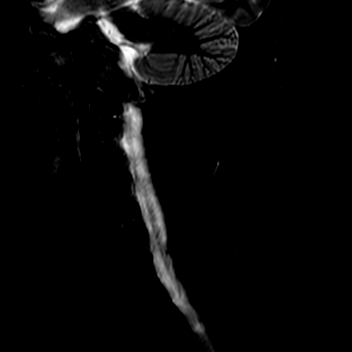
[im 8/15]
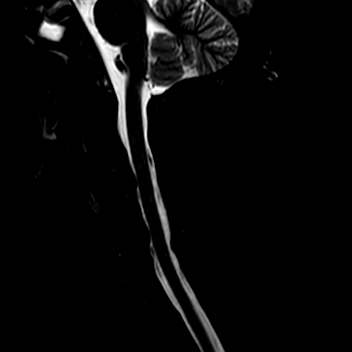
[im 10/15]
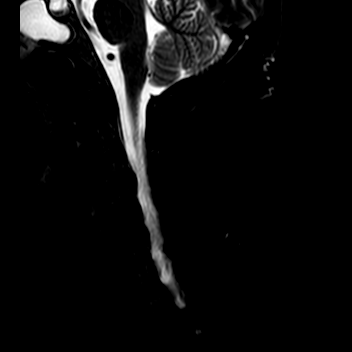
[im 12/15]
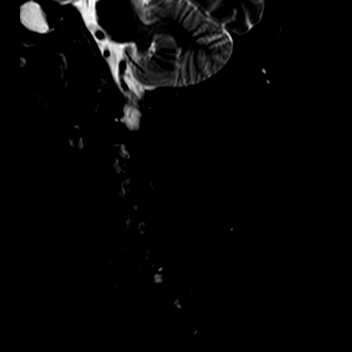
[im 15/15]
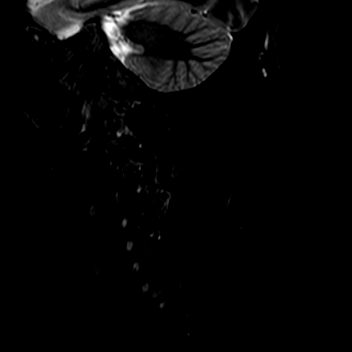

[Series 1301: t2w_tse_ax · axial · 3.0mm · 0.36mm/px · z∈[-38,+6]mm · 4 of 39 slices shown]
[im 3/39]
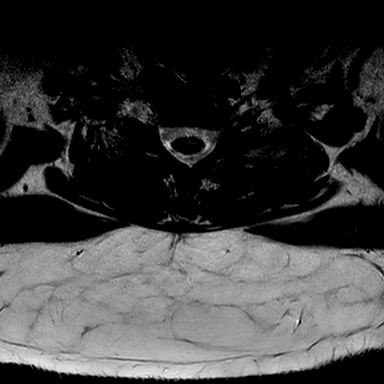
[im 7/39]
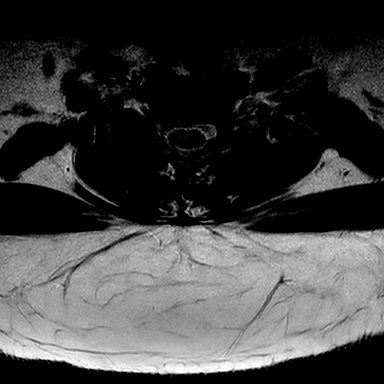
[im 11/39]
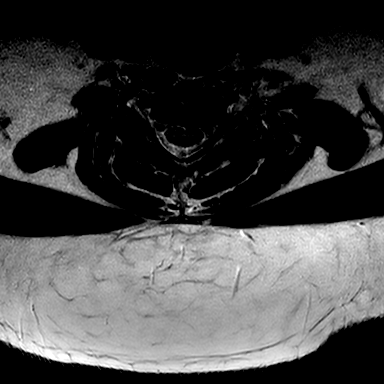
[im 17/39]
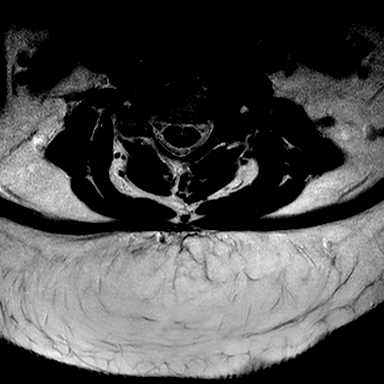

[33 of 48 positions shown; findings below may reference images not displayed]

FINDINGS: Cervical vertebral heights are intact. Lordosis is straightened. 
There is no significant subluxation. There is disc-osteophyte at C3-4 and C5-6 
touching the cord without significant deformity. There is mild canal stenosis at 
both levels, canal diameter approximately 9 mm. The canal is open at other 
levels. 
Axial images show moderate marked left foraminal stenosis at C3-4. There is mild 
to moderate left foraminal narrowing at C4-5. Moderate left foraminal narrowing 
at C5-6. Mild to moderate bilateral foraminal stenosis at C6-7. Other foramina 
appear open. 
Allowing for mild motion artifact cord signal is normal. The dens is intact. The 
craniocervical junction is open. There is no endplate or facet edema. 
There appears to be a large retention cyst in the sphenoid sinus.
IMPRESSION: Cervical spondylosis. There is mild disc-osteophyte at C3-4 and C5-6 touching 
but not deforming the cord. There is mild canal stenosis at both levels. There 
is multilevel foraminal stenosis, most pronounced on the left at C3-4 and C5-6. 
There is mild/moderate bilateral foraminal stenosis at C6-7. 
Straightened lordosis suggest muscle spasm. 
Cord signal is normal. There is no evidence for fracture or malignancy. 
Opacified sphenoid sinuses likely due to large retention cyst. Similar finding 
on the August 2012 cranial CT. If indicated follow-up CT of the paranasal sinuses 
would be useful. 
Note made of increased lipomatous tissue in the posterior cervical subcutaneous 
region. No discrete lipoma identified. The appearance is grossly similar to the 
October 2019 cervical radiograph. This likely reflects patient body habitus; 
clinical correlation advised.
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
## 2021-09-17 IMAGING — MG MAMMOGRAPHY DIAGNOSTIC BILATERAL 3[PERSON_NAME]
8 series · 8 of 24 positions shown · non-contrast
Comparison: Comparison was made to prior examinations.

________________________________________________________________________________________________ 
MAMMOGRAPHY DIAGNOSTIC BILATERAL 3NUX MARZULLO, RIGHT BREAST ULTRASOUND UNILATERAL 
LIMITED, 09/17/2021 [DATE]: 
CLINICAL INDICATION: Palpable abnormality inferior RIGHT breast.
TECHNIQUE: Digital bilateral mammograms and 3-D Tomosynthesis were obtained. 
These were interpreted both primarily and with the aid of computer-aided 
detection system. Limited sonography of the palpable area in the inferior LEFT 
breast is also performed. 
BREAST DENSITY: (Level B) There are scattered areas of fibroglandular density.

[L CC]
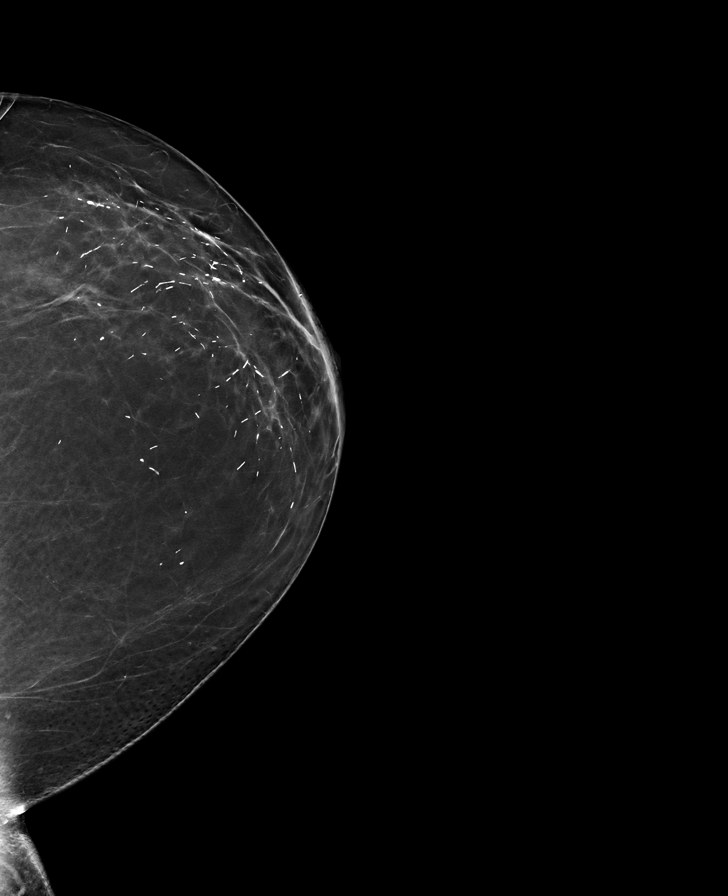

[R MLO]
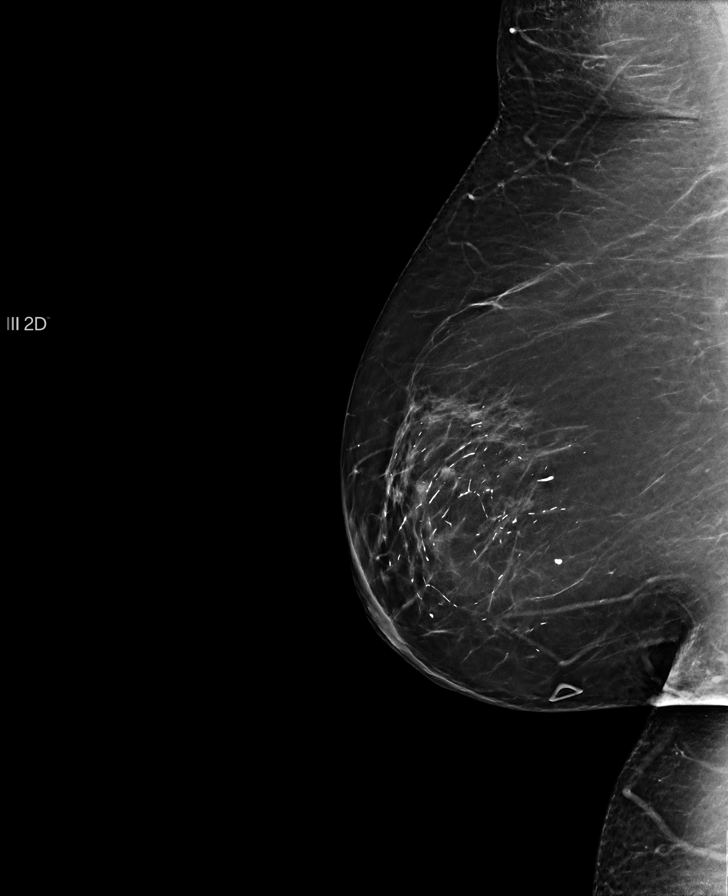

[L MLO]
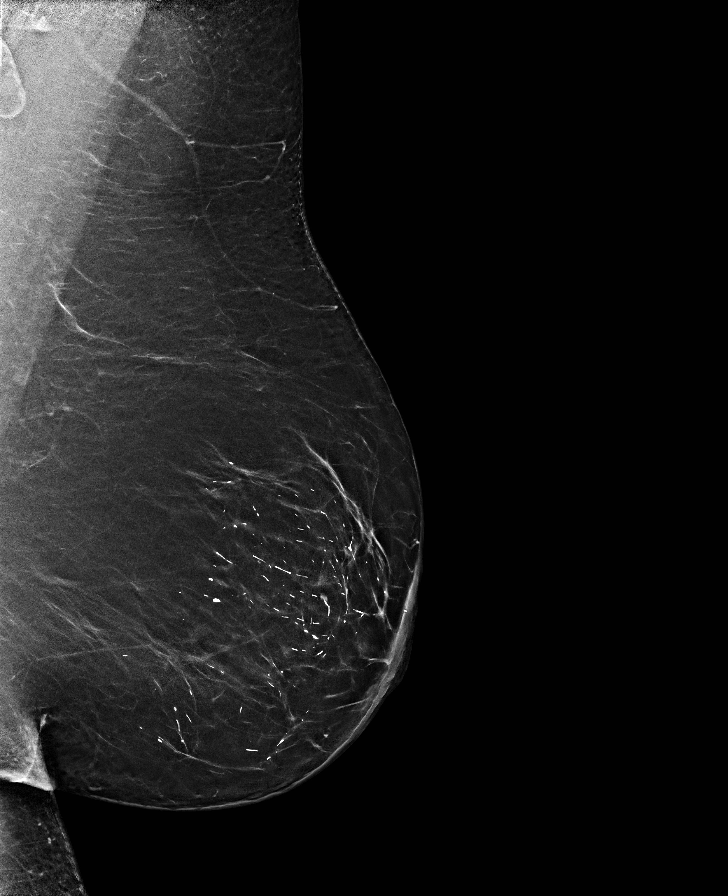

[R CC]
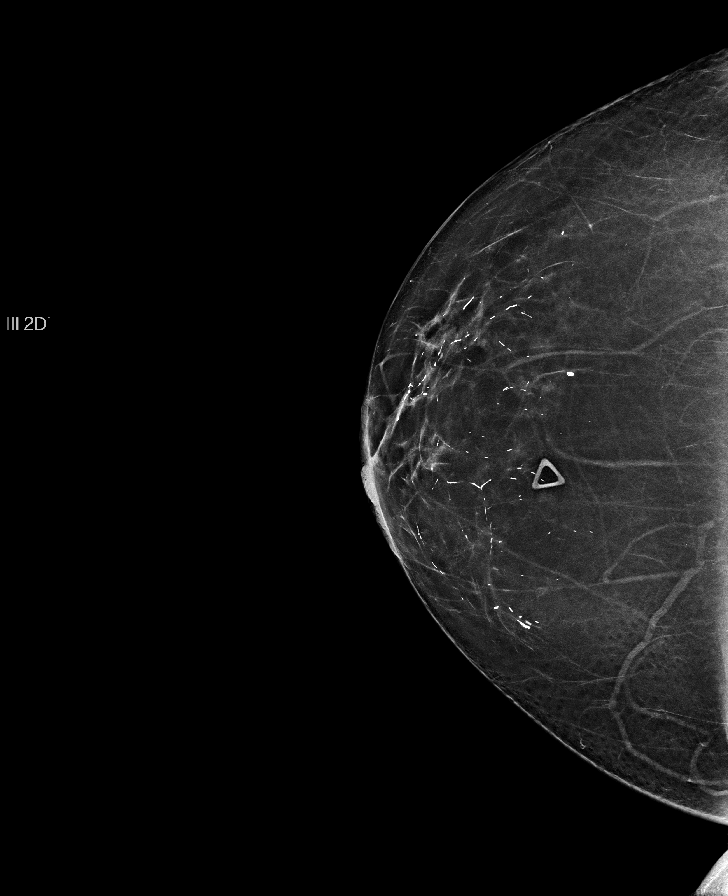

[R MLO tomo · tomo slice 44/87.0]
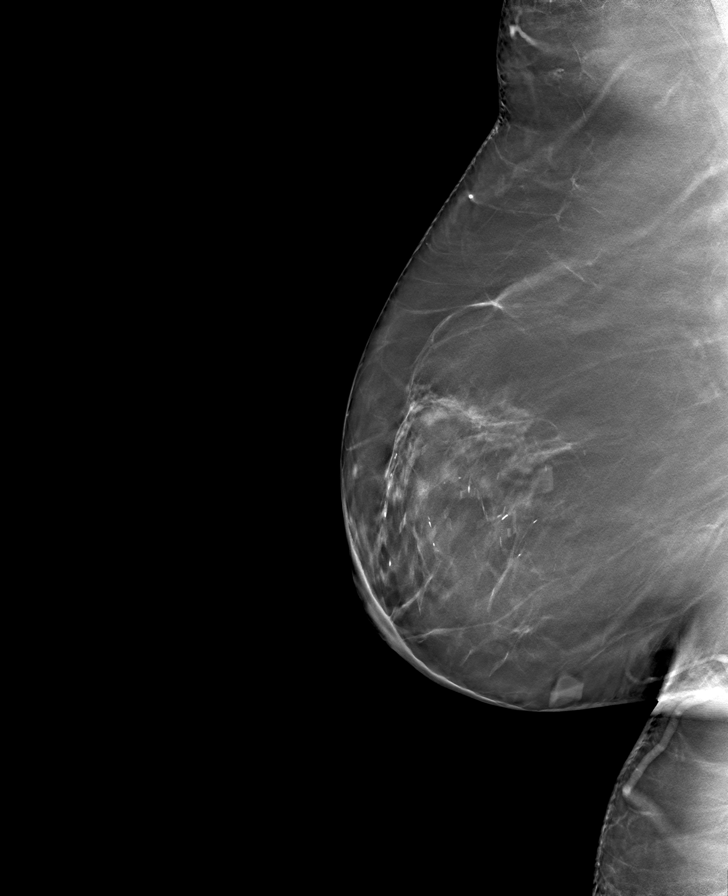

[L MLO tomo · tomo slice 43/85.0]
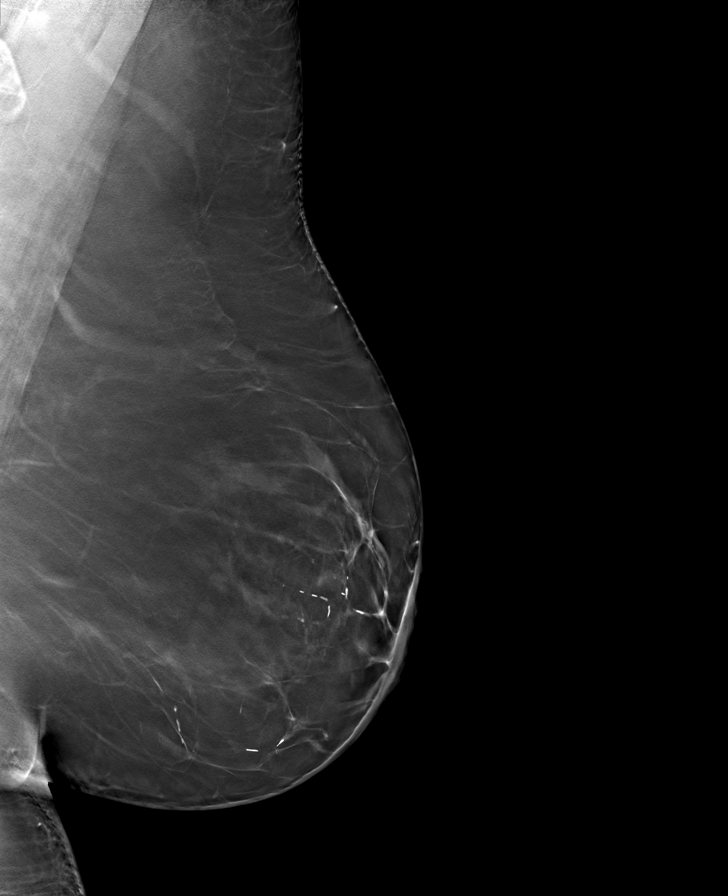

[R CC tomo · tomo slice 37/73.0]
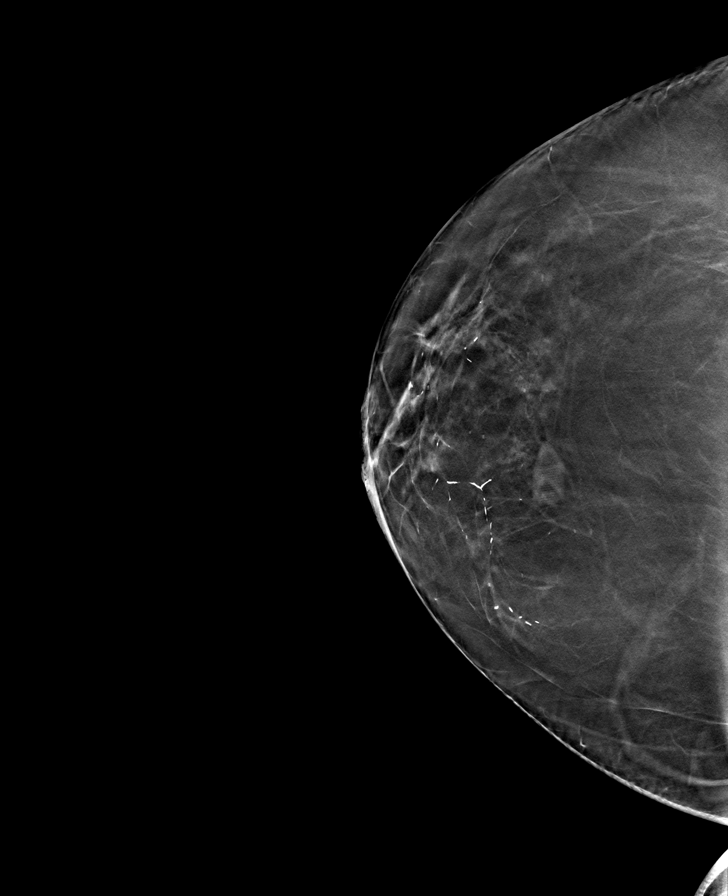

[L CC tomo · tomo slice 35/69.0]
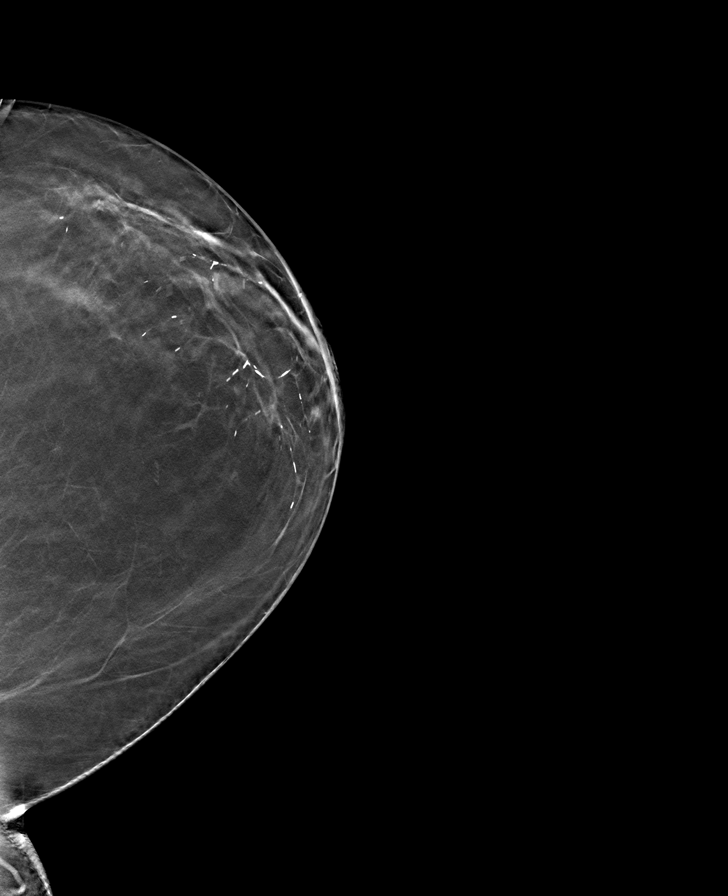

[8 of 24 positions shown; findings below may reference images not displayed]

FINDINGS: Bilateral diagnostic mammography: There are benign bilateral secretory 
calcifications. There are no mammographically suspicious findings. There is no 
abnormality at the site of palpable concern in the inferior RIGHT breast. There 
has been no change from prior mammograms.  
Limited sonography RIGHT breast: The inferior aspect of the RIGHT breast was 
evaluated with sonography but no abnormalities were detected.
IMPRESSION: (BI-RADS 2) Benign findings. Routine mammographic follow-up is recommended.

## 2022-05-24 IMAGING — DX CERVICAL SPINE 4 VIEWS
1 series · 4 of 4 positions shown · non-contrast
Comparison: MR exam of 01/09/2021. Radiographs of 10/15/2019.

________________________________________________________________________________________________ 
CERVICAL SPINE 4 VIEWS, 05/24/2022 [DATE]: 
CLINICAL INDICATION: Cervicalgia.

[Series 1: AP · U · 0.14mm/px · 4 of 4 slices shown]
[im 1/4]
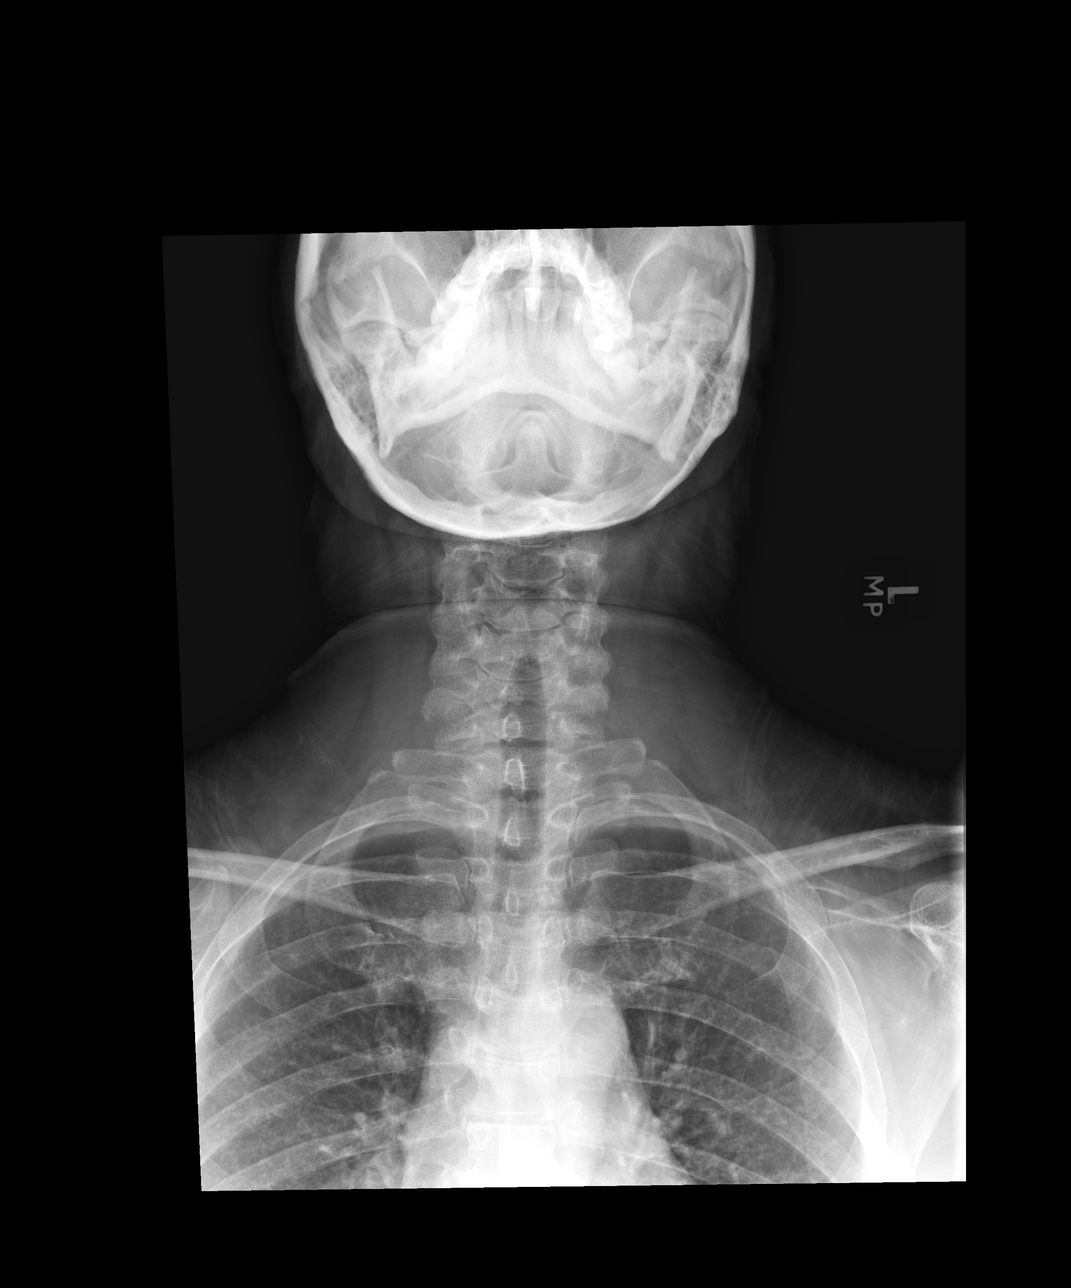
[im 2/4]
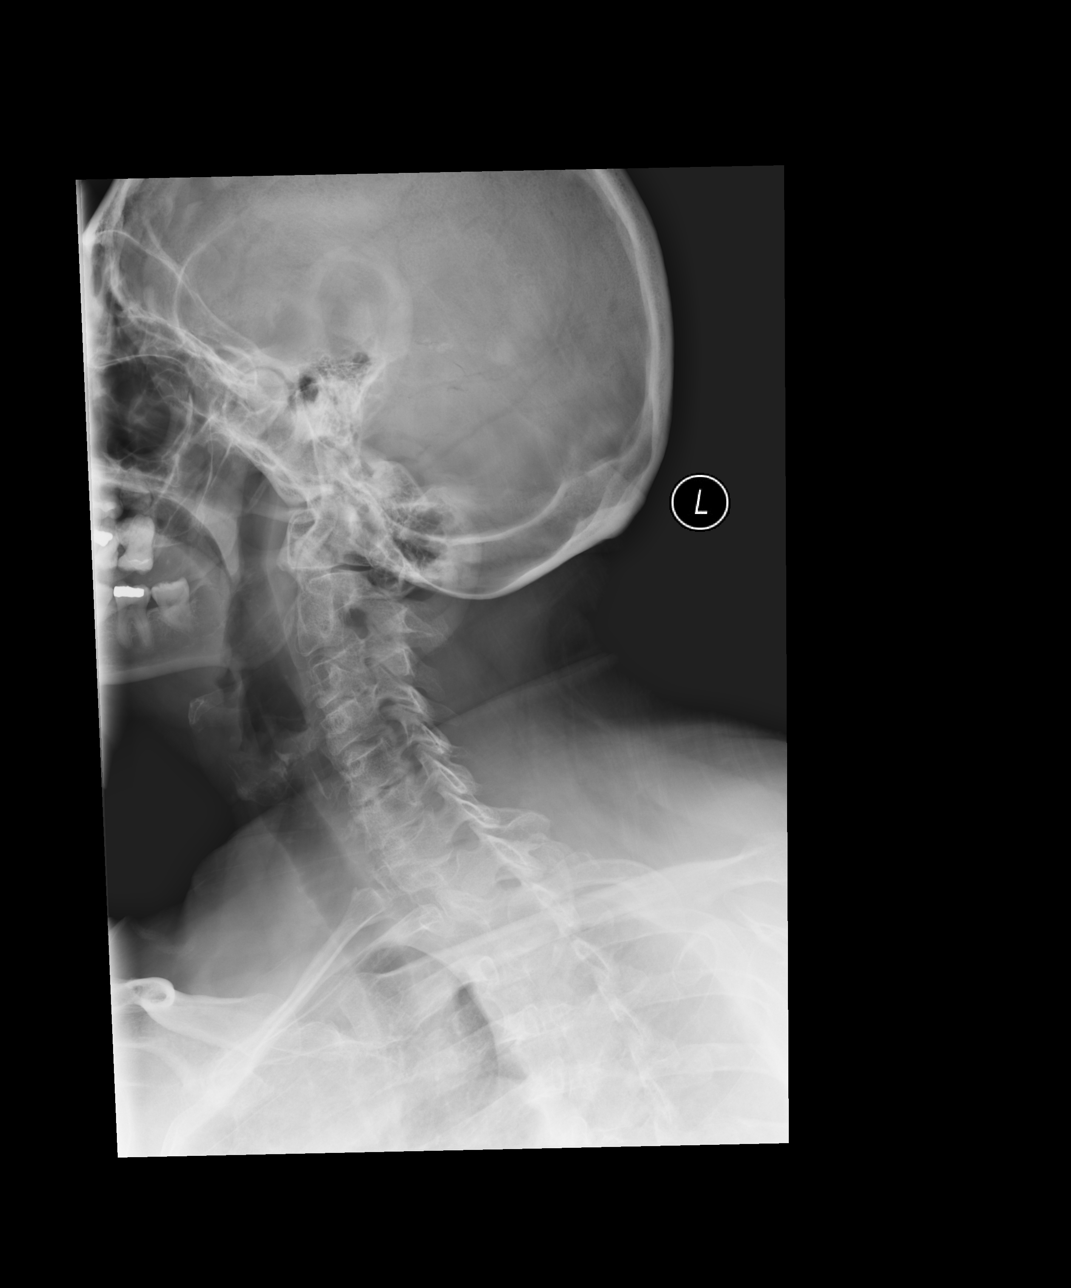
[im 3/4]
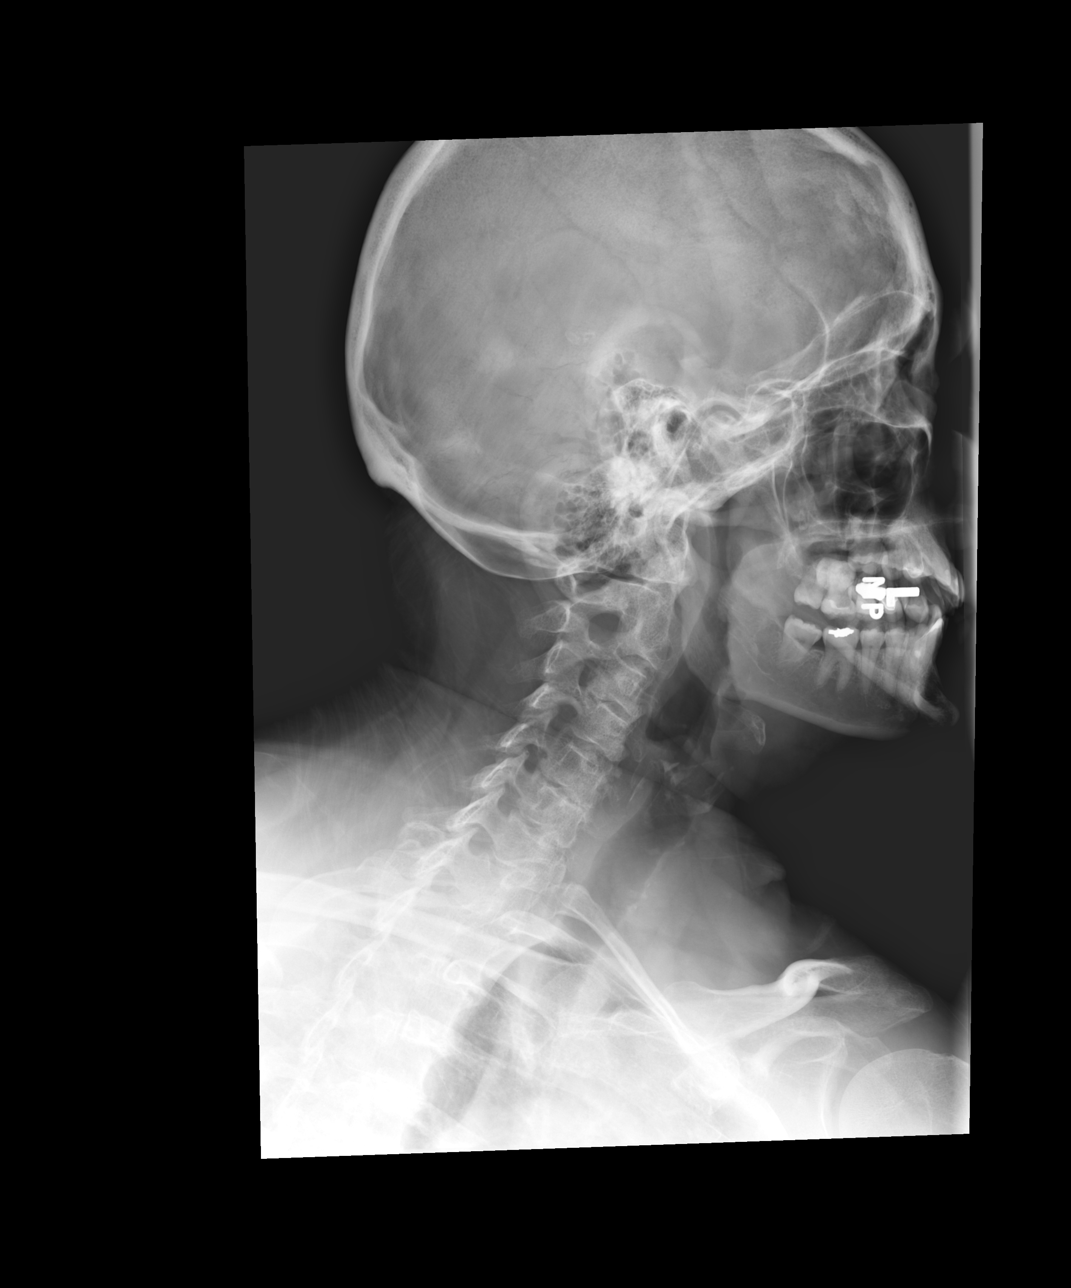
[im 4/4]
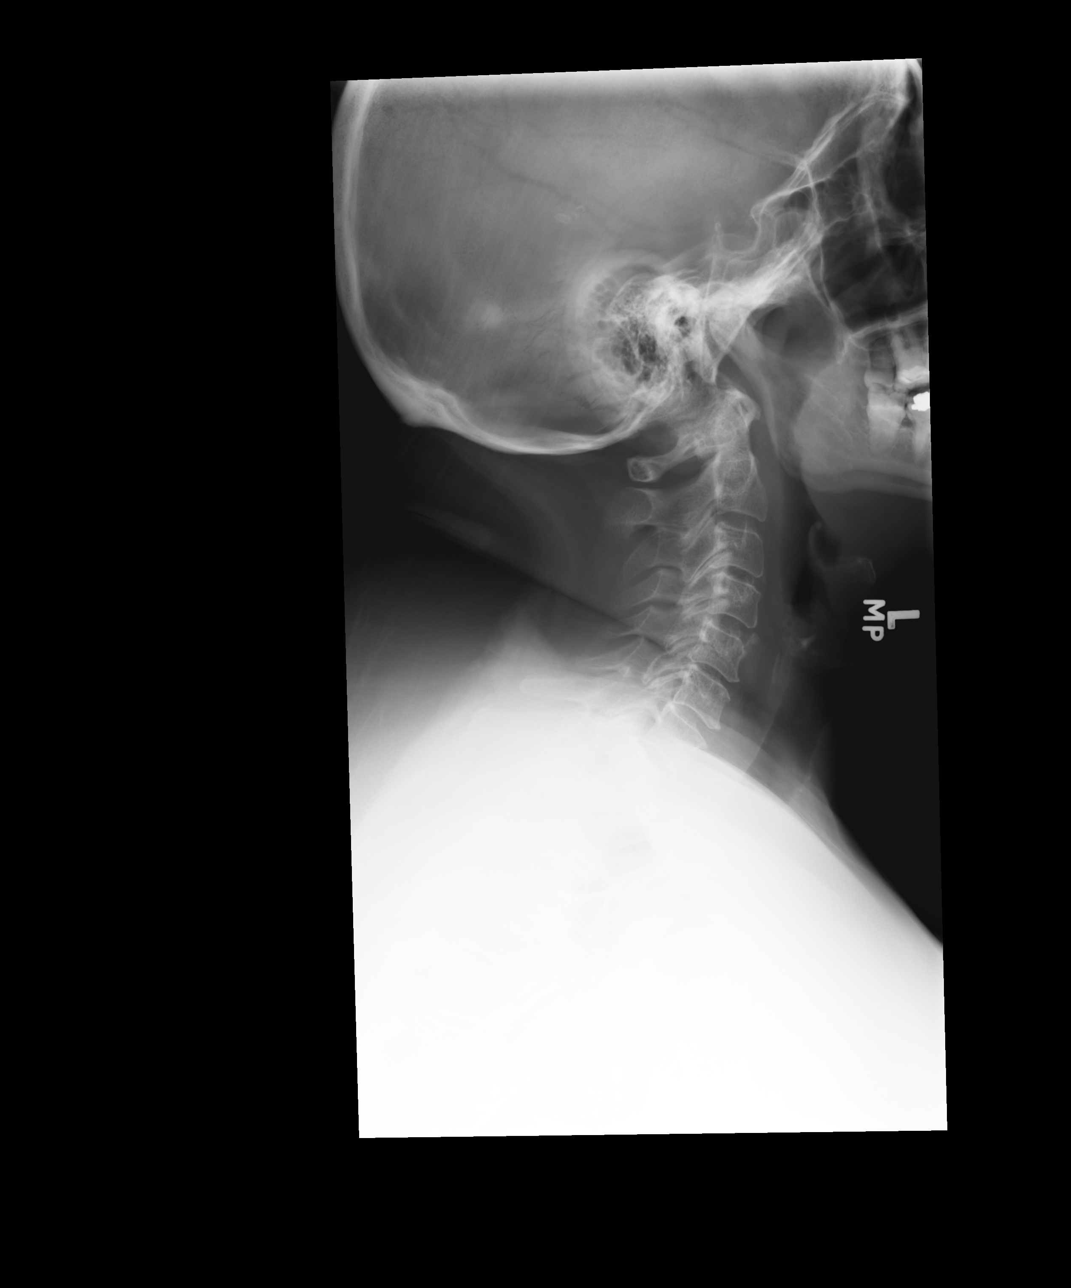

[4 of 4 positions shown; findings below may reference images not displayed]

FINDINGS: Scattered disc height loss and osteophytic spurring greatest at C5-C6. 
Multilevel uncovertebral and facet hypertrophy. No vertebral body fracture. No 
spondylolisthesis. Lung apices are clear.
IMPRESSION: Moderately advanced spondylotic changes cervical spine.
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
## 2022-05-24 IMAGING — DX HIP BILATERAL WITH PELVIS 5 VIEWS
1 series · 5 of 5 positions shown · non-contrast
Comparison: None.

________________________________________________________________________________________________ 
HIP BILATERAL WITH PELVIS 5 VIEWS, 05/24/2022 [DATE]: 
CLINICAL INDICATION: Bilateral hip pain.

[Series 1: AP · U · 0.14mm/px · 5 of 5 slices shown]
[im 1/5]
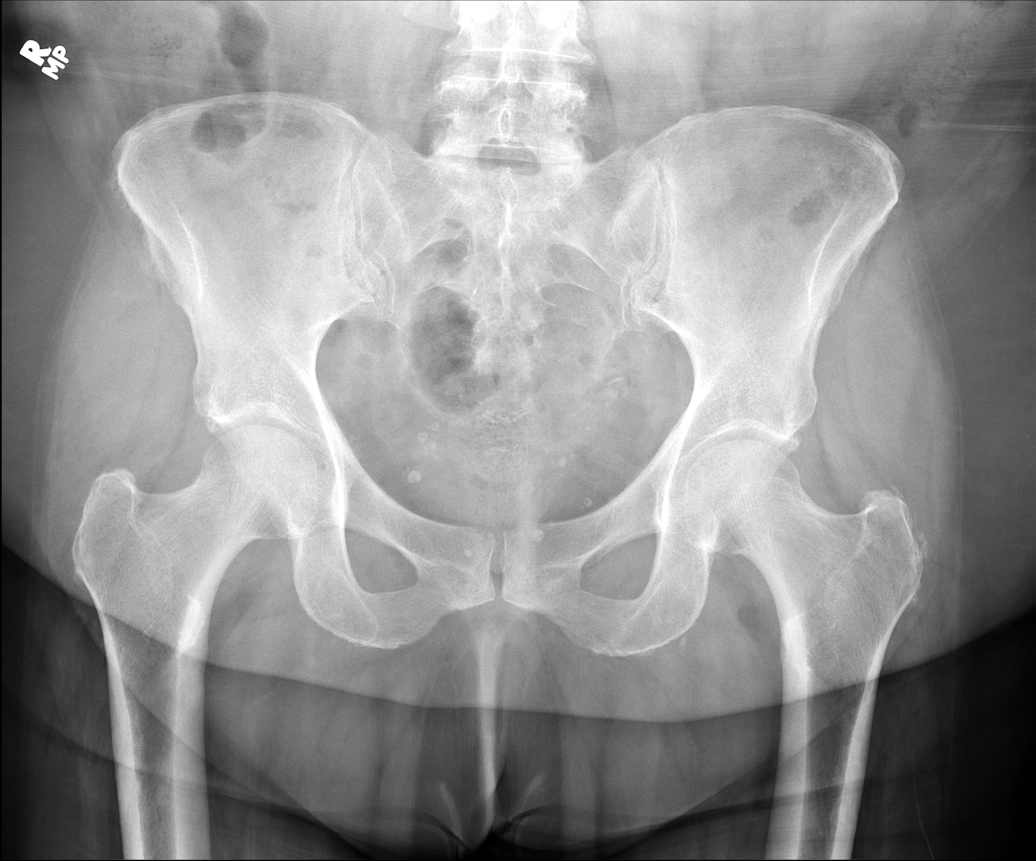
[im 2/5]
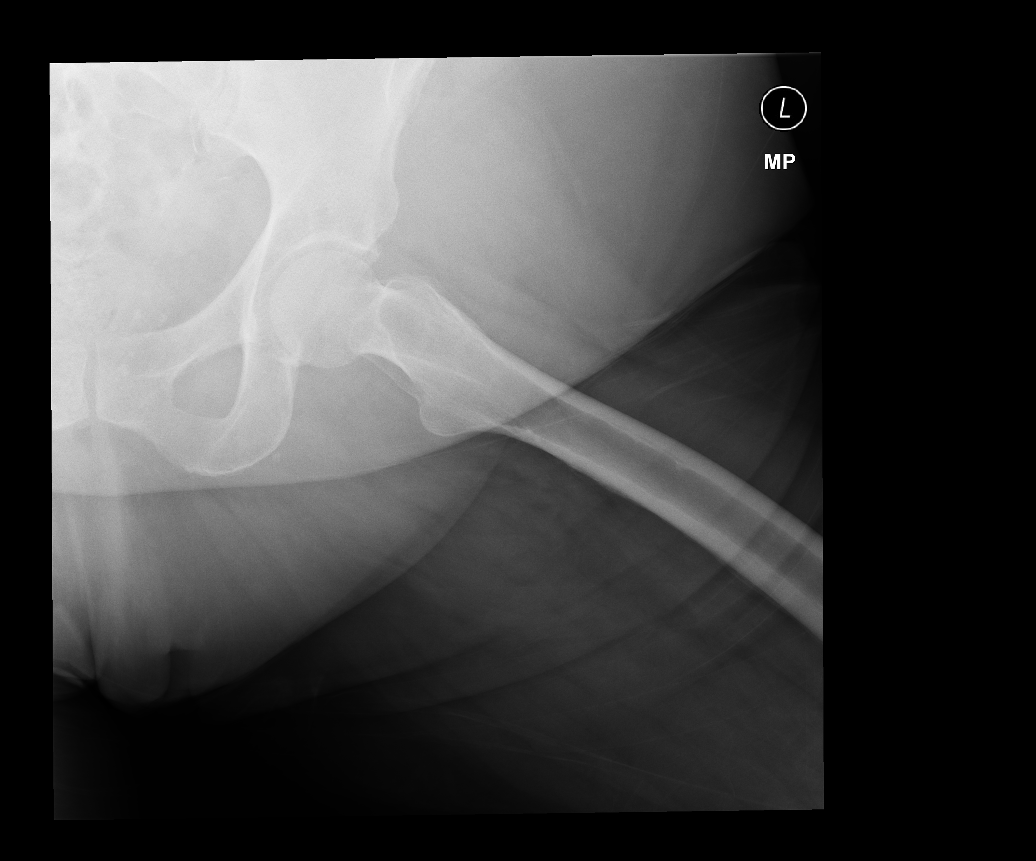
[im 3/5]
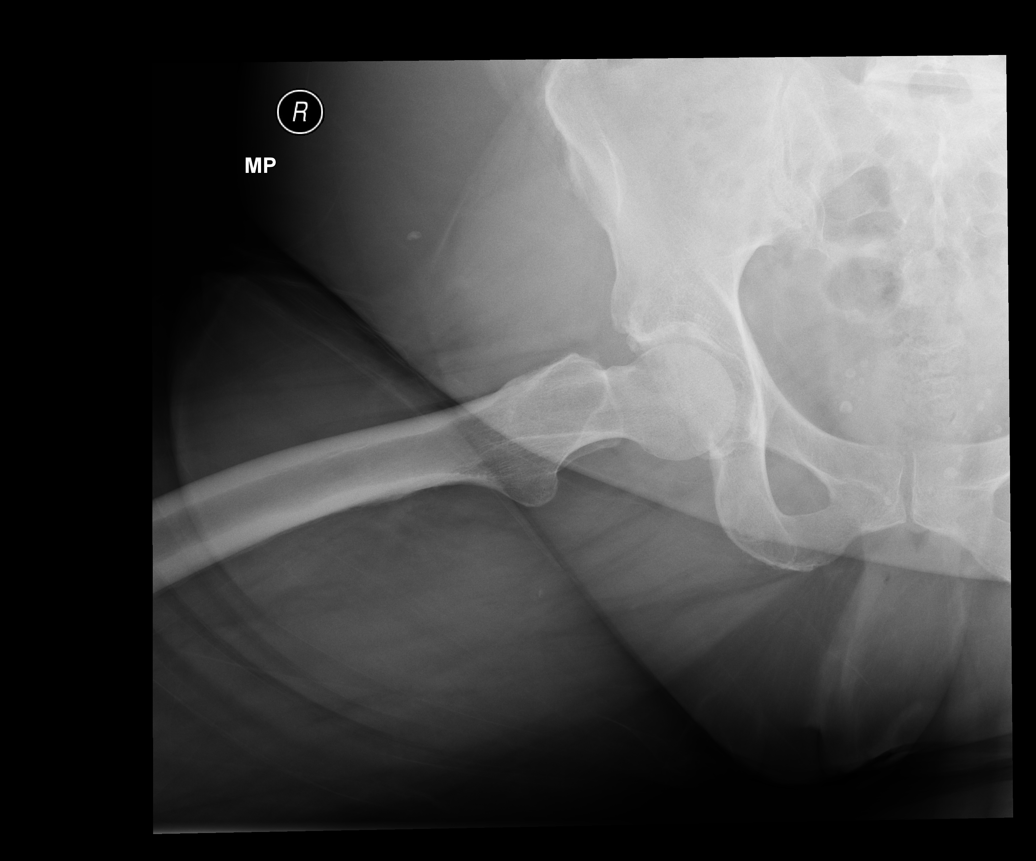
[im 4/5]
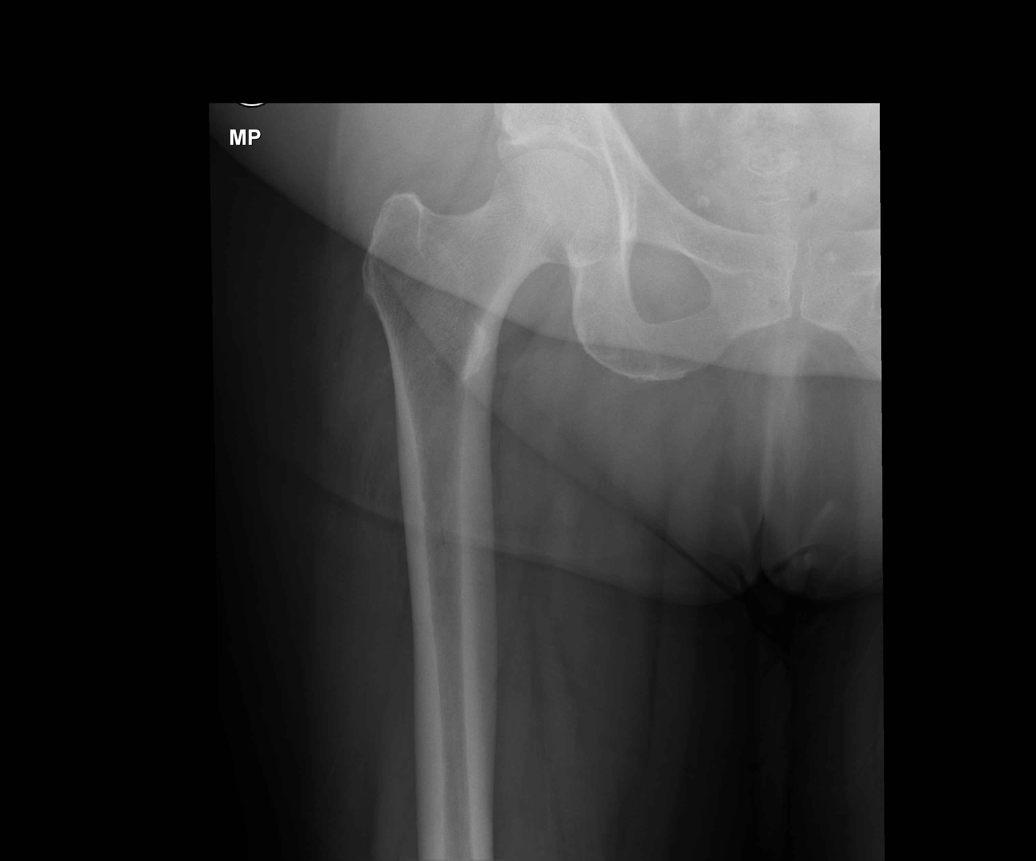
[im 5/5]
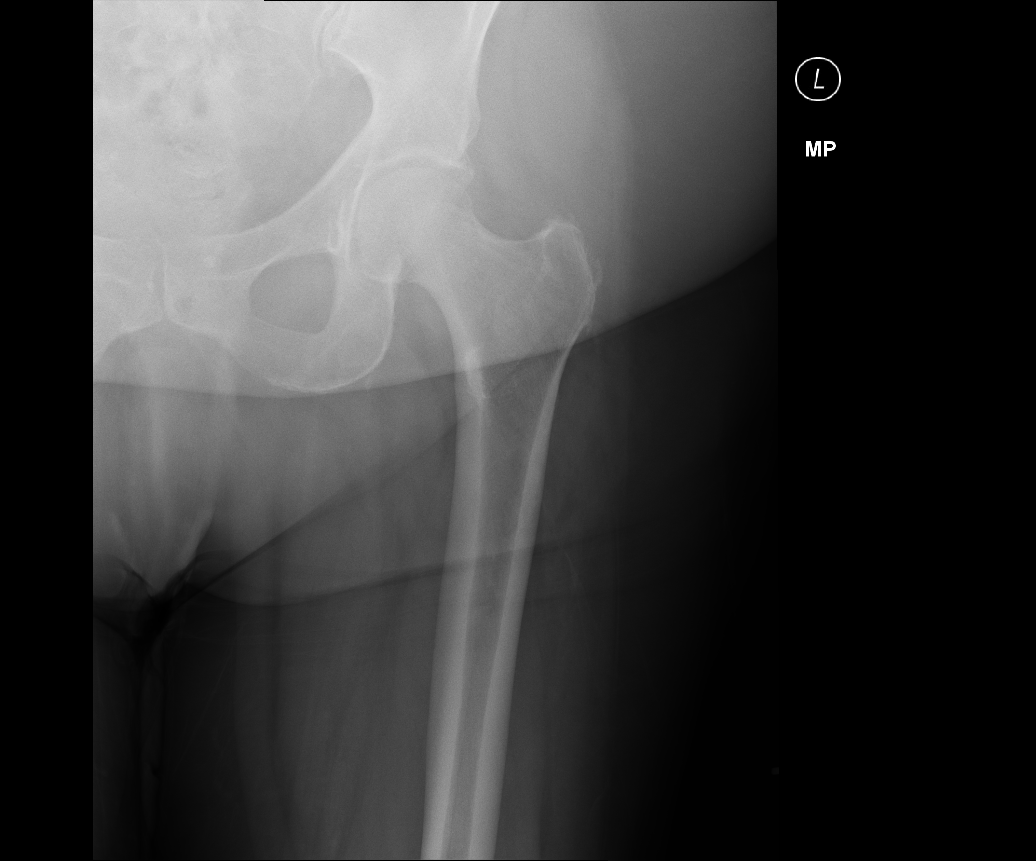

[5 of 5 positions shown; findings below may reference images not displayed]

FINDINGS: No fracture. Normal alignment. Hip joint space heights are preserved. 
Small acetabular osteophytes. Left gluteal calcific tendinosis and mild 
bilateral enthesopathy. Mild degenerative change of the spine. Osteopenia. 
Pelvic phleboliths.
IMPRESSION: 1.  Mild degenerative change. 
2.  Osteopenia: DXA with TBS (trabecular bone score) may be helpful for further 
evaluation.
